# Patient Record
Sex: Female | Born: 1955
Health system: Southern US, Community
[De-identification: ages and names within clinical notes are randomized; demographics above are authoritative.]

## PROBLEM LIST (undated history)

## (undated) DIAGNOSIS — E559 Vitamin D deficiency, unspecified: Secondary | ICD-10-CM

## (undated) DIAGNOSIS — R0789 Other chest pain: Secondary | ICD-10-CM

## (undated) DIAGNOSIS — Z86018 Personal history of other benign neoplasm: Secondary | ICD-10-CM

## (undated) DIAGNOSIS — I5189 Other ill-defined heart diseases: Secondary | ICD-10-CM

## (undated) DIAGNOSIS — E785 Hyperlipidemia, unspecified: Secondary | ICD-10-CM

## (undated) HISTORY — DX: Other ill-defined heart diseases: I51.89

## (undated) HISTORY — PX: TUBAL LIGATION: SHX77

## (undated) HISTORY — PX: CARDIAC CATHETERIZATION: SHX172

## (undated) HISTORY — DX: Vitamin D deficiency, unspecified: E55.9

## (undated) HISTORY — DX: Hyperlipidemia, unspecified: E78.5

## (undated) HISTORY — DX: Other chest pain: R07.89

---

## 2018-03-07 ENCOUNTER — Other Ambulatory Visit (HOSPITAL_COMMUNITY): Payer: Self-pay | Admitting: Internal Medicine

## 2018-03-07 DIAGNOSIS — D151 Benign neoplasm of heart: Secondary | ICD-10-CM

## 2018-03-14 ENCOUNTER — Ambulatory Visit (HOSPITAL_COMMUNITY)
Admission: RE | Admit: 2018-03-14 | Discharge: 2018-03-14 | Disposition: A | Discharge status: Home / Self Care | Payer: BLUE CROSS/BLUE SHIELD | Source: Ambulatory Visit | Attending: Internal Medicine | Admitting: Internal Medicine

## 2018-03-14 DIAGNOSIS — D151 Benign neoplasm of heart: Secondary | ICD-10-CM | POA: Insufficient documentation

## 2018-03-14 DIAGNOSIS — R2240 Localized swelling, mass and lump, unspecified lower limb: Secondary | ICD-10-CM | POA: Diagnosis not present

## 2018-03-14 DIAGNOSIS — I517 Cardiomegaly: Secondary | ICD-10-CM | POA: Diagnosis not present

## 2018-03-14 MED ORDER — GADOBENATE DIMEGLUMINE 529 MG/ML IV SOLN
20.0000 mL | Freq: Once | INTRAVENOUS | Status: AC | PRN
  Administered 2018-03-14: 20 mL via INTRAVENOUS

## 2018-03-31 LAB — CREATININE, SERUM: Creatinine, Ser: 0.66 mg/dL (ref 0.61–1.24)

## 2018-04-16 ENCOUNTER — Encounter: Payer: Self-pay | Admitting: *Deleted

## 2018-04-16 DIAGNOSIS — E781 Pure hyperglyceridemia: Secondary | ICD-10-CM

## 2018-04-16 DIAGNOSIS — E559 Vitamin D deficiency, unspecified: Secondary | ICD-10-CM | POA: Insufficient documentation

## 2018-04-16 DIAGNOSIS — E785 Hyperlipidemia, unspecified: Secondary | ICD-10-CM | POA: Insufficient documentation

## 2018-04-17 ENCOUNTER — Encounter: Payer: BLUE CROSS/BLUE SHIELD | Admitting: Thoracic Surgery (Cardiothoracic Vascular Surgery)

## 2018-04-22 ENCOUNTER — Encounter: Payer: Self-pay | Admitting: Thoracic Surgery (Cardiothoracic Vascular Surgery)

## 2018-04-22 ENCOUNTER — Other Ambulatory Visit: Payer: Self-pay | Admitting: Thoracic Surgery (Cardiothoracic Vascular Surgery)

## 2018-04-22 ENCOUNTER — Other Ambulatory Visit: Payer: Self-pay

## 2018-04-22 ENCOUNTER — Institutional Professional Consult (permissible substitution): Payer: BLUE CROSS/BLUE SHIELD | Admitting: Thoracic Surgery (Cardiothoracic Vascular Surgery)

## 2018-04-22 VITALS — BP 134/86 | HR 86 | Resp 16 | Ht 66.0 in | Wt 128.0 lb

## 2018-04-22 DIAGNOSIS — R079 Chest pain, unspecified: Secondary | ICD-10-CM | POA: Insufficient documentation

## 2018-04-22 DIAGNOSIS — I7101 Dissection of thoracic aorta: Secondary | ICD-10-CM

## 2018-04-22 DIAGNOSIS — I5189 Other ill-defined heart diseases: Secondary | ICD-10-CM

## 2018-04-22 DIAGNOSIS — I71019 Dissection of thoracic aorta, unspecified: Secondary | ICD-10-CM

## 2018-04-22 DIAGNOSIS — R0789 Other chest pain: Secondary | ICD-10-CM | POA: Diagnosis not present

## 2018-04-22 DIAGNOSIS — D151 Benign neoplasm of heart: Secondary | ICD-10-CM | POA: Insufficient documentation

## 2018-04-22 DIAGNOSIS — I519 Heart disease, unspecified: Secondary | ICD-10-CM | POA: Diagnosis not present

## 2018-04-22 NOTE — Patient Instructions (Signed)
Continue all previous medications without any changes at this time  

## 2018-04-22 NOTE — Progress Notes (Signed)
[address]       [place] [address]             [phone]     CARDIOTHORACIC SURGERY CONSULTATION REPORT  Referring Provider is Raelyn Number, MD PCP is Raelyn Number, MD  Chief Complaint  Patient presents with  . Atrial Mass    Surgical eval ..Marland KitchenMRI CARDIAC MORPH. 03/14/18..OUTSIDE ECHO 02/26/18    HPI:  Patient is a 62 year old female who has been referred for evaluation of left atrial mass suspicious for possible atrial myxoma.  Patient is originally from [place] but has lived in [place] for more than 15 years.  Approximately 2 years ago she began to experience symptoms of chest discomfort that are somewhat atypical but exacerbated by physical exertion.  Symptoms are described as mild dull pain across her chest that occasionally radiates through to the back.  Symptoms are "always present" but seem to be relieved by rest.  Symptoms have been associated with decreased energy and exertional shortness of breath.  The patient was reportedly evaluated while she was traveling in [place] and told that she had a likely atrial myxoma.  Surgical intervention was recommended but the patient desired to return home for medical therapy.  She was recently seen by her primary care physician and an echocardiogram performed February 26, 2018.  By report the echocardiogram revealed normal left ventricular size and systolic function with an echogenic mass in the left atrium measuring 1.9 x 1.4 cm suspicious for atrial myxoma.  The patient subsequently underwent cardiac gated MRI on March 14, 2018.  This confirmed the presence of a heterogeneous globular nonenhancing mass in the left atrium attached to the intra-atrial septum measuring 2.4 x 2.0 cm.  There was normal left ventricular size and systolic function.  On delayed gadolinium imaging there was no late enhancement.  No other abnormalities were noted and TEE was recommended.  The patient was referred for surgical consultation.  The  patient is widowed and lives locally in [place].  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  She lives with 2 of her daughters.  She has remained healthy all of her life and reasonably active physically.  She describes a gradual decline in energy with decreased exercise tolerance.  Appetite is decreased but the patient has not lost weight.  She describes dull pain across her chest which now seems to radiate through to her back.  The pain is exacerbated by physical exertion and partially relieved by rest although the patient states the pain never completely goes away.  She describes mild exertional shortness of breath.  Shortness of breath and discomfort are made worse by laying flat in bed.  She has not had any palpitations, dizzy spells, nor syncope.  She denies any history of lower extremity edema or chronic cough.  Past Medical History:  Diagnosis Date  . Atypical chest pain   . Hyperlipidemia   . Left atrial mass   . Vitamin D deficiency     Past Surgical History:  Procedure Laterality Date  . TUBAL LIGATION      History reviewed. No pertinent family history.  Social History   Socioeconomic History  . Marital status: Widowed    Spouse name: Not on file  . Number of children: Not on file  . Years of education: Not on file  . Highest education level: Not on file  Occupational History  . Not on file  Social Needs  .  Financial resource strain: Not on file  . Food insecurity:    Worry: Not on file    Inability: Not on file  . Transportation needs:    Medical: Not on file    Non-medical: Not on file  Tobacco Use  . Smoking status: Never Smoker  . Smokeless tobacco: Never Used  Substance and Sexual Activity  . Alcohol use: Never    Frequency: Never  . Drug use: Never  . Sexual activity: Not on file  Lifestyle  . Physical activity:    Days per week: Not on file    Minutes per session: Not on file  . Stress: Not on file  Relationships  . Social  connections:    Talks on phone: Not on file    Gets together: Not on file    Attends religious service: Not on file    Active member of club or organization: Not on file    Attends meetings of clubs or organizations: Not on file    Relationship status: Not on file  . Intimate partner violence:    Fear of current or ex partner: Not on file    Emotionally abused: Not on file    Physically abused: Not on file    Forced sexual activity: Not on file  Other Topics Concern  . Not on file  Social History Narrative  . Not on file    Current Outpatient Medications  Medication Sig Dispense Refill  . atorvastatin (LIPITOR) 40 MG tablet Take 40 mg by mouth daily.    . Vitamin D, Ergocalciferol, 2000 units CAPS Take 1 capsule by mouth daily.     No current facility-administered medications for this visit.     Not on File    Review of Systems:   General:  decreased appetite, decreased energy, no weight gain, no weight loss, no fever  Cardiac:  + chest pain with exertion, mild chest pain at rest, + SOB with exertion, no resting SOB, no PND, + orthopnea, no palpitations, no arrhythmia, no atrial fibrillation, no LE edema, no dizzy spells, no syncope  Respiratory:  no shortness of breath, no home oxygen, no productive cough, no dry cough, no bronchitis, no wheezing, no hemoptysis, no asthma, no pain with inspiration or cough, no sleep apnea, no CPAP at night  GI:   no difficulty swallowing, no reflux, no frequent heartburn, no hiatal hernia, no abdominal pain, + constipation, no diarrhea, no hematochezia, no hematemesis, no melena  GU:   no dysuria,  no frequency, no urinary tract infection, no hematuria, no kidney stones, no kidney disease  Vascular:  no pain suggestive of claudication, no pain in feet, no leg cramps, no varicose veins, no DVT, no non-healing foot ulcer  Neuro:   no stroke, no TIA's, no seizures, no headaches, no temporary blindness one eye,  no slurred speech, no peripheral  neuropathy, no chronic pain, no instability of gait, no memory/cognitive dysfunction  Musculoskeletal: no arthritis, no joint swelling, no myalgias, no difficulty walking, normal mobility   Skin:   no rash, no itching, no skin infections, no pressure sores or ulcerations  Psych:   no anxiety, no depression, no nervousness, no unusual recent stress  Eyes:   no blurry vision, no floaters, no recent vision changes, does not wear glasses or contacts  ENT:   no hearing loss, no loose or painful teeth, full set dentures  Hematologic:  no easy bruising, no abnormal bleeding, no clotting disorder, no frequent epistaxis  Endocrine:  no diabetes, does not check CBG's at home     Physical Exam:   BP 134/86 (BP Location: Right Arm, Patient Position: Sitting, Cuff Size: Large)   Pulse 86   Resp 16   Ht 5\' 6"  (1.676 m)   Wt 128 lb (58.1 kg)   SpO2 97% Comment: ON RA  BMI 20.66 kg/m   General:    well-appearing  HEENT:  Unremarkable   Neck:   no JVD, no bruits, no adenopathy   Chest:   clear to auscultation, symmetrical breath sounds, no wheezes, no rhonchi   CV:   RRR, no  murmur   Abdomen:  soft, non-tender, no masses   Extremities:  warm, well-perfused, pulses diminished but palpable, no LE edema  Rectal/GU  Deferred  Neuro:   Grossly non-focal and symmetrical throughout  Skin:   Clean and dry, no rashes, no breakdown   Diagnostic Tests:  TRANSTHORACIC ECHOCARDIOGRAM  Report from transthoracic echocardiogram performed February 26, 2018 is reviewed.  Images from this report are not currently available for review.  By report the patient had normal left ventricular size and systolic function with ejection fraction estimated 60 to 65%.  There was normal left atrial size with an echogenic mass measuring 1.9 x 1.4 cm seen attached to the intra-atrial septum and towards the posterior wall of the left atrium, most consistent with atrial myxoma.  There was normal right atrial size.  Atrial septum appeared  normal with no evidence for intra-atrial shunt.  The aortic valve appeared normal.  The mitral valve appeared normal with trace mitral regurgitation.  No other significant abnormalities were noted.   CARDIAC MRI  TECHNIQUE: The patient was scanned on a 1.5 Tesla GE magnet. A dedicated cardiac coil was used. Functional imaging was done using Fiesta sequences. 2,3, and 4 chamber views were done to assess for RWMA's. Modified Simpson's rule using a short axis stack was used to calculate an ejection fraction on a dedicated work Conservation officer, nature. The patient received 30 cc of Multihance. After 10 minutes inversion recovery sequences were used to assess for infiltration and scar tissue.  CONTRAST:  30 cc Multihance  FINDINGS: Limited images of the lung fields showed no gross abnormalities.  Normal left ventricular size and wall thickness. Normal wall motion with EF 75%. Mildly dilated RV with normal systolic function. Normal left and right atrial sizes. There was a 2.4 x 2.0 cm heterogeneous globular non-enhancing mass in the left atrium attached to the interatrial septum. Trileaflet aortic valve with no significant regurgitation or stenosis. No significant mitral regurgitation.  On delayed enhancement imaging, there was no myocardial late gadolinium enhancement (LGE).  Measurements:  LVEDV 81 mL  LVSV 60 mL  LVEF 75%  IMPRESSION: 1.  Normal LV size with EF 75%, normal wall motion.  2.  Mildly dilated RV with normal systolic function.  3. No myocardial LGE so no evidence for prior infarction, infiltrative disease, or myocarditis.  4. 2.4 x 2.2 cm heterogeneous globular non-enhancing mass in the LA attached to the interatrial septum. This could be a left atrial myxoma, less likely thrombus. Would do TEE to assess more closely.  Dalton Mclean   Electronically Signed   By: Loralie Champagne M.D.   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  Patient will need elective surgical resection.  TEE would be useful to confirm the likely diagnosis and further evaluate the size and location of the presumed  attachment to the intra-atrial septum.  Diagnostic cardiac catheterization will need to be performed to rule out the presence of significant coronary artery disease.  In the absence of significant coronary artery disease or other complicating features, the patient may be a good candidate for minimally invasive approach for surgery.   Plan:  I have discussed the nature of the patient's presumed diagnosis and the indications for surgical resection at length with the patient and her family in the office today.  Alternative surgical approaches have been discussed including a comparison between conventional median sternotomy and minimally invasive techniques.  Risks associated with surgery and expectations for the patient's convalescence have been discussed.  The patient is interested in proceeding with surgery as soon as practical.  As a next step the patient will be referred for cardiology consultation and further diagnostic testing to include TEE and coronary angiography.  CT angiography of the aorta and iliac vessels will be performed to evaluate the feasibility of peripheral cannulation for surgery.  We tentatively plan to proceed with surgery on May 15, 2018.  Patient will return to our office for follow-up prior to surgery on May 12, 2018.  All questions answered.   I spent in excess of 90 minutes during the conduct of this office consultation and >50% of this time involved direct face-to-face encounter with the patient for counseling and/or coordination of their care.    Valentina Gu. Roxy Manns, MD 04/22/2018 1:40 PM

## 2018-04-23 ENCOUNTER — Other Ambulatory Visit: Payer: Self-pay

## 2018-04-23 ENCOUNTER — Encounter: Payer: BLUE CROSS/BLUE SHIELD | Admitting: Thoracic Surgery (Cardiothoracic Vascular Surgery)

## 2018-04-23 DIAGNOSIS — D151 Benign neoplasm of heart: Secondary | ICD-10-CM

## 2018-04-24 ENCOUNTER — Ambulatory Visit (HOSPITAL_COMMUNITY): Payer: BLUE CROSS/BLUE SHIELD

## 2018-04-24 ENCOUNTER — Other Ambulatory Visit: Payer: Self-pay

## 2018-04-24 ENCOUNTER — Encounter (HOSPITAL_COMMUNITY): Payer: Self-pay

## 2018-04-24 ENCOUNTER — Ambulatory Visit (HOSPITAL_COMMUNITY)
Admission: RE | Admit: 2018-04-24 | Discharge: 2018-04-24 | Disposition: A | Discharge status: Home / Self Care | Payer: BLUE CROSS/BLUE SHIELD | Source: Ambulatory Visit | Attending: Cardiology | Admitting: Cardiology

## 2018-04-24 ENCOUNTER — Encounter (HOSPITAL_COMMUNITY)
Admission: RE | Disposition: A | Discharge status: Home / Self Care | Payer: Self-pay | Source: Ambulatory Visit | Attending: Cardiology

## 2018-04-24 DIAGNOSIS — D151 Benign neoplasm of heart: Secondary | ICD-10-CM | POA: Insufficient documentation

## 2018-04-24 DIAGNOSIS — R0789 Other chest pain: Secondary | ICD-10-CM | POA: Insufficient documentation

## 2018-04-24 DIAGNOSIS — E785 Hyperlipidemia, unspecified: Secondary | ICD-10-CM | POA: Insufficient documentation

## 2018-04-24 DIAGNOSIS — R931 Abnormal findings on diagnostic imaging of heart and coronary circulation: Secondary | ICD-10-CM | POA: Diagnosis present

## 2018-04-24 HISTORY — PX: TEE WITHOUT CARDIOVERSION: SHX5443

## 2018-04-24 SURGERY — ECHOCARDIOGRAM, TRANSESOPHAGEAL
Anesthesia: Moderate Sedation

## 2018-04-24 MED ORDER — BUTAMBEN-TETRACAINE-BENZOCAINE 2-2-14 % EX AERO
INHALATION_SPRAY | CUTANEOUS | Status: DC | PRN
  Administered 2018-04-24: 2 via TOPICAL

## 2018-04-24 MED ORDER — MIDAZOLAM HCL 5 MG/5ML IJ SOLN
INTRAMUSCULAR | Status: DC | PRN
  Administered 2018-04-24: 1 mg via INTRAVENOUS
  Administered 2018-04-24 (×2): 2 mg via INTRAVENOUS

## 2018-04-24 MED ORDER — FENTANYL CITRATE (PF) 100 MCG/2ML IJ SOLN
INTRAMUSCULAR | Status: AC
  Filled 2018-04-24: qty 2

## 2018-04-24 MED ORDER — SODIUM CHLORIDE 0.9 % IV SOLN
INTRAVENOUS | Status: DC
  Administered 2018-04-24: 500 mL via INTRAVENOUS

## 2018-04-24 MED ORDER — MIDAZOLAM HCL 5 MG/ML IJ SOLN
INTRAMUSCULAR | Status: AC
  Filled 2018-04-24: qty 2

## 2018-04-24 MED ORDER — FENTANYL CITRATE (PF) 100 MCG/2ML IJ SOLN
INTRAMUSCULAR | Status: DC | PRN
  Administered 2018-04-24: 50 ug via INTRAVENOUS

## 2018-04-24 NOTE — Discharge Instructions (Signed)
Transesophageal Echocardiogram Transesophageal echocardiography (TEE) is a special type of test that produces images of the heart by using sound waves (echocardiogram). This type of echocardiography can obtain better images of the heart than standard echocardiography. TEE is done by passing a flexible tube down the esophagus. The heart is located in front of the esophagus. Because the heart and esophagus are close to one another, your health care provider can take very clear, detailed pictures of the heart via ultrasound waves. TEE may be done:  If your health care provider needs more information based on standard echocardiography findings.  If you had a stroke. This might have happened because a clot formed in your heart. TEE can visualize different areas of the heart and check for clots.  To check valve anatomy and function.  To check for infection on the inside of your heart (endocarditis).  To evaluate the dividing wall (septum) of the heart and presence of a hole that did not close after birth (patent foramen ovale or atrial septal defect).  To help diagnose a tear in the wall of the aorta (aortic dissection).  During cardiac valve surgery. This allows the surgeon to assess the valve repair before closing the chest.  During a variety of other cardiac procedures to guide positioning of catheters.  Sometimes before a cardioversion, which is a shock to convert heart rhythm back to normal.  Tell a health care provider about:  Any allergies you have.  All medicines you are taking, including vitamins, herbs, eye drops, creams, and over-the-counter medicines.  Any problems you or family members have had with anesthetic medicines.  Any blood disorders you have.  Any surgeries you have had.  Any medical conditions you have.  Swallowing difficulties.  An esophageal obstruction. What are the risks? Generally, TEE is a safe procedure. However, as with any procedure, complications can  occur. Possible complications include an esophageal tear (rupture). What happens before the procedure?  Do not eat or drink for 6 hours before the procedure or as directed by your health care provider.  Arrange for someone to drive you home after the procedure. Do not drive yourself home. During the procedure, you will be given medicines that can continue to make you feel drowsy and can impair your reflexes.  An IV access tube will be started in the arm. What happens during the procedure?  A medicine to help you relax (sedative) will be given through the IV access tube.  A medicine may be sprayed or gargled to numb the back of the throat.  Your blood pressure, heart rate, and breathing (vital signs) will be monitored during the procedure.  The TEE probe is a long, flexible tube. The tip of the probe is placed into the back of the mouth, and you will be asked to swallow. This helps to pass the tip of the probe into the esophagus. Once the tip of the probe is in the correct area, your health care provider can take pictures of the heart.  TEE is usually not a painful procedure. You may feel the probe press against the back of the throat. The probe does not enter the trachea and does not affect your breathing. What happens after the procedure?  You will be in bed, resting, until you have fully returned to consciousness.  When you first awaken, your throat may feel slightly sore and will probably still feel numb. This will improve slowly over time.  You will not be allowed to eat or drink until  it is clear that the numbness has improved.  Once you have been able to drink, urinate, and sit on the edge of the bed without feeling sick to your stomach (nausea) or dizzy, you may be cleared to go home.  You should have a friend or family member with you for the next 24 hours after your procedure. This information is not intended to replace advice given to you by your health care provider. Make  sure you discuss any questions you have with your health care provider. Document Released: 10/27/2002 Document Revised: 01/12/2016 Document Reviewed: 02/05/2013 Elsevier Interactive Patient Education  [employer].

## 2018-04-24 NOTE — Interval H&P Note (Signed)
History and Physical Interval Note:  04/24/2018 8:44 AM  Hannah Bond  has presented today for surgery, with the diagnosis of LEFT ATRIAL MYXOMA  The various methods of treatment have been discussed with the patient and family. After consideration of risks, benefits and other options for treatment, the patient has consented to  Procedure(s): TRANSESOPHAGEAL ECHOCARDIOGRAM (TEE) (N/A) as a surgical intervention .  The patient's history has been reviewed, patient examined, no change in status, stable for surgery.  I have reviewed the patient's chart and labs.  Questions were answered to the patient's satisfaction.     Adrian Prows

## 2018-04-24 NOTE — H&P (Signed)
Hannah Bond is an 62 y.o. female.   Chief Complaint: Here for TEE for suspected atrial mass HPI: Hannah Bond  is a 63 y.o. female  With suspected atrial mass and has been scheduled for TEE. No chest pain or dyspnea.  Past Medical History:  Diagnosis Date  . Atypical chest pain   . Hyperlipidemia   . Left atrial mass   . Vitamin D deficiency     Past Surgical History:  Procedure Laterality Date  . TUBAL LIGATION      History reviewed. No pertinent family history. Social History:  reports that she has never smoked. She has never used smokeless tobacco. She reports that she does not drink alcohol or use drugs.  Allergies: No Known Allergies    Blood pressure (!) 180/99, pulse 85, temperature 98.5 F (36.9 C), temperature source Oral, resp. rate 16, height 5\' 6"  (1.676 m), weight 58.1 kg, SpO2 100 %. Body mass index is 20.67 kg/m. Physical Exam  Constitutional: She is oriented to person, place, and time. She appears well-developed and well-nourished.  HENT:  Head: Atraumatic.  Eyes: Conjunctivae are normal.  Neck: Normal range of motion. Neck supple.  Cardiovascular: Normal rate, regular rhythm and intact distal pulses.  No murmur heard. Pulmonary/Chest: Effort normal and breath sounds normal.  Abdominal: Soft. Bowel sounds are normal.  Musculoskeletal: Normal range of motion.  Neurological: She is alert and oriented to person, place, and time.  Psychiatric: She has a normal mood and affect.    Medications Prior to Admission  Medication Sig Dispense Refill  . atorvastatin (LIPITOR) 40 MG tablet Take 40 mg by mouth daily.    Marland Kitchen ibuprofen (ADVIL,MOTRIN) 200 MG tablet Take 200 mg by mouth every 6 (six) hours as needed for headache or moderate pain.    . Vitamin D, Ergocalciferol, 2000 units CAPS Take 2,000 Units by mouth 3 (three) times a week.         Current Facility-Administered Medications:  .  0.9 %  sodium chloride infusion, , Intravenous, Continuous, Patwardhan,  Manish J, MD, Last Rate: 20 mL/hr at 04/24/18 0714, 500 mL at 04/24/18 0714 .  butamben-tetracaine-benzocaine (CETACAINE) spray, , , PRN, Adrian Prows, MD, 2 spray at 04/24/18 [phone] .  fentaNYL (SUBLIMAZE) injection, , , PRN, Adrian Prows, MD, 25 mcg at 04/24/18 [phone] .  midazolam (VERSED) 5 MG/5ML injection, , , PRN, Adrian Prows, MD, 2 mg at 04/24/18 1443  CARDIAC STUDIES:  TRANSTHORACIC ECHOCARDIOGRAM  Report from transthoracic echocardiogram performed February 26, 2018 is reviewed.  Images from this report are not currently available for review.  By report the patient had normal left ventricular size and systolic function with ejection fraction estimated 60 to 65%.  There was normal left atrial size with an echogenic mass measuring 1.9 x 1.4 cm seen attached to the intra-atrial septum and towards the posterior wall of the left atrium, most consistent with atrial myxoma.  There was normal right atrial size.  Atrial septum appeared normal with no evidence for intra-atrial shunt.  The aortic valve appeared normal.  The mitral valve appeared normal with trace mitral regurgitation.  No other significant abnormalities were noted.   CARDIAC MRI  TECHNIQUE: The patient was scanned on a 1.5 Tesla GE magnet. A dedicated cardiac coil was used. Functional imaging was done using Fiesta sequences. 2,3, and 4 chamber views were done to assess for RWMA's. Modified Simpson's rule using a short axis stack was used to calculate an ejection fraction on a dedicated work station  using [employer]. The patient received 30 cc of Multihance. After 10 minutes inversion recovery sequences were used to assess for infiltration and scar tissue.  CONTRAST: 30 cc Multihance  FINDINGS: Limited images of the lung fields showed no gross abnormalities.  Normal left ventricular size and wall thickness. Normal wall motion with EF 75%. Mildly dilated RV with normal systolic function. Normal left and right atrial sizes.  There was a 2.4 x 2.0 cm heterogeneous globular non-enhancing mass in the left atrium attached to the interatrial septum. Trileaflet aortic valve with no significant regurgitation or stenosis. No significant mitral regurgitation.  On delayed enhancement imaging, there was no myocardial late gadolinium enhancement (LGE).   Assessment/Plan LA mass suspicious for myxoma for TEE. All questions answered. Daughter  Present for translation  Adrian Prows, MD 04/24/2018, 8:40 AM [facility] [place] Pager: [phone] Office: [phone] If no answer: Cell:  [phone]

## 2018-04-24 NOTE — CV Procedure (Signed)
Indication: Left atrial mass/myxoma.  TEE: Under moderate sedation, TEE was performed without complications: LV: Normal. Normal EF. RV: Normal LA: There is a sessile 2.1x2.1 cm homogenous mass most consistent with LA myxoma attached to the septum primum in the fossa ovalis. No clot noted on the mass. No PFO by color Doppler.  Left atrial appendage: Normal without thrombus. Normal function. Inter atrial septum is intact without defect. Double contrast study negative for atrial level shunting. No late appearance of bubbles either. RA: Normal  MV: Normal Trace MR. TV: Normal Trace TR AV: Normal. No AI or AS. PV: Normal. Trace PI.  Thoracic and ascending aorta: Normal without significant plaque or atheromatous changes.  Conscious sedation protocol was followed, I personally administered conscious sedation and monitored the patient. Patient received 5 milligrams of Versed and 50 mcg fentanyl  . Patient tolerated the procedure well and there was no complication from conscious sedation. Time administered was 15 minutes.

## 2018-04-24 NOTE — H&P (View-Only) (Signed)
Hannah Bond is an 62 y.o. female.   Chief Complaint: Here for TEE for suspected atrial mass HPI: Hannah Bond  is a 62 y.o. female  With suspected atrial mass and has been scheduled for TEE. No chest pain or dyspnea.  Past Medical History:  Diagnosis Date  . Atypical chest pain   . Hyperlipidemia   . Left atrial mass   . Vitamin D deficiency     Past Surgical History:  Procedure Laterality Date  . TUBAL LIGATION      History reviewed. No pertinent family history. Social History:  reports that she has never smoked. She has never used smokeless tobacco. She reports that she does not drink alcohol or use drugs.  Allergies: No Known Allergies    Blood pressure (!) 180/99, pulse 85, temperature 98.5 F (36.9 C), temperature source Oral, resp. rate 16, height 5\' 6"  (1.676 m), weight 58.1 kg, SpO2 100 %. Body mass index is 20.67 kg/m. Physical Exam  Constitutional: She is oriented to person, place, and time. She appears well-developed and well-nourished.  HENT:  Head: Atraumatic.  Eyes: Conjunctivae are normal.  Neck: Normal range of motion. Neck supple.  Cardiovascular: Normal rate, regular rhythm and intact distal pulses.  No murmur heard. Pulmonary/Chest: Effort normal and breath sounds normal.  Abdominal: Soft. Bowel sounds are normal.  Musculoskeletal: Normal range of motion.  Neurological: She is alert and oriented to person, place, and time.  Psychiatric: She has a normal mood and affect.    Medications Prior to Admission  Medication Sig Dispense Refill  . atorvastatin (LIPITOR) 40 MG tablet Take 40 mg by mouth daily.    Marland Kitchen ibuprofen (ADVIL,MOTRIN) 200 MG tablet Take 200 mg by mouth every 6 (six) hours as needed for headache or moderate pain.    . Vitamin D, Ergocalciferol, 2000 units CAPS Take 2,000 Units by mouth 3 (three) times a week.         Current Facility-Administered Medications:  .  0.9 %  sodium chloride infusion, , Intravenous, Continuous, Patwardhan,  Manish J, MD, Last Rate: 20 mL/hr at 04/24/18 0714, 500 mL at 04/24/18 0714 .  butamben-tetracaine-benzocaine (CETACAINE) spray, , , PRN, Adrian Prows, MD, 2 spray at 04/24/18 [phone] .  fentaNYL (SUBLIMAZE) injection, , , PRN, Adrian Prows, MD, 25 mcg at 04/24/18 [phone] .  midazolam (VERSED) 5 MG/5ML injection, , , PRN, Adrian Prows, MD, 2 mg at 04/24/18 6301  CARDIAC STUDIES:  TRANSTHORACIC ECHOCARDIOGRAM  Report from transthoracic echocardiogram performed February 26, 2018 is reviewed.  Images from this report are not currently available for review.  By report the patient had normal left ventricular size and systolic function with ejection fraction estimated 60 to 65%.  There was normal left atrial size with an echogenic mass measuring 1.9 x 1.4 cm seen attached to the intra-atrial septum and towards the posterior wall of the left atrium, most consistent with atrial myxoma.  There was normal right atrial size.  Atrial septum appeared normal with no evidence for intra-atrial shunt.  The aortic valve appeared normal.  The mitral valve appeared normal with trace mitral regurgitation.  No other significant abnormalities were noted.   CARDIAC MRI  TECHNIQUE: The patient was scanned on a 1.5 Tesla GE magnet. A dedicated cardiac coil was used. Functional imaging was done using Fiest[employer] sequences. 2,3, and 4 chamber views were done to assess for RWMA's. Modified Simpson's rule using a short axis stack was used to calculate an ejection fraction on a dedicated work station  using [employer]. The patient received 30 cc of Multihance. After 10 minutes inversion recovery sequences were used to assess for infiltration and scar tissue.  CONTRAST: 30 cc Multihance  FINDINGS: Limited images of the lung fields showed no gross abnormalities.  Normal left ventricular size and wall thickness. Normal wall motion with EF 75%. Mildly dilated RV with normal systolic function. Normal left and right atrial sizes.  There was a 2.4 x 2.0 cm heterogeneous globular non-enhancing mass in the left atrium attached to the interatrial septum. Trileaflet aortic valve with no significant regurgitation or stenosis. No significant mitral regurgitation.  On delayed enhancement imaging, there was no myocardial late gadolinium enhancement (LGE).   Assessment/Plan LA mass suspicious for myxoma for TEE. All questions answered. Daughter  Present for translation  Adrian Prows, MD 04/24/2018, 8:40 AM [facility] [place] Pager: [phone] Office: [phone] If no answer: Cell:  [phone]

## 2018-04-28 ENCOUNTER — Encounter (HOSPITAL_COMMUNITY): Payer: Self-pay | Admitting: Cardiology

## 2018-04-29 ENCOUNTER — Ambulatory Visit (HOSPITAL_COMMUNITY)
Admission: RE | Disposition: A | Discharge status: Home / Self Care | Payer: Self-pay | Source: Ambulatory Visit | Attending: Cardiology

## 2018-04-29 ENCOUNTER — Ambulatory Visit (HOSPITAL_COMMUNITY)
Admission: RE | Admit: 2018-04-29 | Discharge: 2018-04-29 | Disposition: A | Discharge status: Home / Self Care | Payer: BLUE CROSS/BLUE SHIELD | Source: Ambulatory Visit | Attending: Cardiology | Admitting: Cardiology

## 2018-04-29 DIAGNOSIS — E785 Hyperlipidemia, unspecified: Secondary | ICD-10-CM | POA: Insufficient documentation

## 2018-04-29 DIAGNOSIS — I10 Essential (primary) hypertension: Secondary | ICD-10-CM | POA: Diagnosis not present

## 2018-04-29 DIAGNOSIS — R0789 Other chest pain: Secondary | ICD-10-CM | POA: Diagnosis not present

## 2018-04-29 DIAGNOSIS — D151 Benign neoplasm of heart: Secondary | ICD-10-CM | POA: Diagnosis not present

## 2018-04-29 DIAGNOSIS — Z01810 Encounter for preprocedural cardiovascular examination: Secondary | ICD-10-CM | POA: Insufficient documentation

## 2018-04-29 HISTORY — PX: RIGHT/LEFT HEART CATH AND CORONARY ANGIOGRAPHY: CATH118266

## 2018-04-29 LAB — POCT I-STAT 3, VENOUS BLOOD GAS (G3P V)
ACID-BASE DEFICIT: 1 mmol/L (ref 0.0–2.0)
BICARBONATE: 25.1 mmol/L (ref 20.0–28.0)
BICARBONATE: 25.6 mmol/L (ref 20.0–28.0)
O2 SAT: 77 %
O2 SAT: 78 %
PCO2 VEN: 46 mmHg (ref 44.0–60.0)
PH VEN: 7.354 (ref 7.250–7.430)
PO2 VEN: 44 mmHg (ref 32.0–45.0)
TCO2: 27 mmol/L (ref 22–32)
TCO2: 27 mmol/L (ref 22–32)
pCO2, Ven: 46 mmHg (ref 44.0–60.0)
pH, Ven: 7.346 (ref 7.250–7.430)
pO2, Ven: 45 mmHg (ref 32.0–45.0)

## 2018-04-29 LAB — POCT I-STAT 3, ART BLOOD GAS (G3+)
ACID-BASE DEFICIT: 1 mmol/L (ref 0.0–2.0)
BICARBONATE: 24.7 mmol/L (ref 20.0–28.0)
O2 Saturation: 95 %
PH ART: 7.364 (ref 7.350–7.450)
TCO2: 26 mmol/L (ref 22–32)
pCO2 arterial: 43.3 mmHg (ref 32.0–48.0)
pO2, Arterial: 80 mmHg — ABNORMAL LOW (ref 83.0–108.0)

## 2018-04-29 LAB — BASIC METABOLIC PANEL
Anion gap: 9 (ref 5–15)
BUN: 10 mg/dL (ref 8–23)
CHLORIDE: 106 mmol/L (ref 98–111)
CO2: 26 mmol/L (ref 22–32)
Calcium: 9.6 mg/dL (ref 8.9–10.3)
Creatinine, Ser: 0.58 mg/dL (ref 0.44–1.00)
GFR calc non Af Amer: >60 mL/min (ref >60)
Glucose, Bld: 103 mg/dL — ABNORMAL HIGH (ref 70–99)
POTASSIUM: 4 mmol/L (ref 3.5–5.1)
Sodium: 141 mmol/L (ref 135–145)

## 2018-04-29 LAB — CBC
HCT: 38.9 % (ref 36.0–46.0)
Hemoglobin: 12.8 g/dL (ref 12.0–15.0)
MCH: 29.3 pg (ref 26.0–34.0)
MCHC: 32.9 g/dL (ref 30.0–36.0)
MCV: 89 fL (ref 78.0–100.0)
Platelets: 216 10*3/uL (ref 150–400)
RBC: 4.37 MIL/uL (ref 3.87–5.11)
RDW: 12 % (ref 11.5–15.5)
WBC: 6.8 10*3/uL (ref 4.0–10.5)

## 2018-04-29 SURGERY — RIGHT/LEFT HEART CATH AND CORONARY ANGIOGRAPHY
Anesthesia: LOCAL

## 2018-04-29 MED ORDER — LIDOCAINE HCL (PF) 1 % IJ SOLN
INTRAMUSCULAR | Status: DC | PRN
  Administered 2018-04-29 (×2): 2 mL

## 2018-04-29 MED ORDER — SODIUM CHLORIDE 0.9 % IV SOLN
INTRAVENOUS | Status: AC | PRN
  Administered 2018-04-29: 250 mL via INTRAVENOUS

## 2018-04-29 MED ORDER — HYDROMORPHONE HCL 1 MG/ML IJ SOLN
INTRAMUSCULAR | Status: AC
  Filled 2018-04-29: qty 0.5

## 2018-04-29 MED ORDER — SODIUM CHLORIDE 0.9 % IV SOLN: 250.0000 mL | INTRAVENOUS | Status: DC | PRN

## 2018-04-29 MED ORDER — MIDAZOLAM HCL 2 MG/2ML IJ SOLN
INTRAMUSCULAR | Status: AC
  Filled 2018-04-29: qty 2

## 2018-04-29 MED ORDER — SODIUM CHLORIDE 0.9% FLUSH: 3.0000 mL | INTRAVENOUS | Status: DC | PRN

## 2018-04-29 MED ORDER — ASPIRIN 81 MG PO CHEW
81.0000 mg | CHEWABLE_TABLET | ORAL | Status: AC
  Administered 2018-04-29: 81 mg via ORAL

## 2018-04-29 MED ORDER — VERAPAMIL HCL 2.5 MG/ML IV SOLN
INTRAVENOUS | Status: DC | PRN
  Administered 2018-04-29: 14:00:00 via INTRA_ARTERIAL

## 2018-04-29 MED ORDER — HEPARIN SODIUM (PORCINE) 1000 UNIT/ML IJ SOLN
INTRAMUSCULAR | Status: AC
  Filled 2018-04-29: qty 1

## 2018-04-29 MED ORDER — LIDOCAINE HCL (PF) 1 % IJ SOLN
INTRAMUSCULAR | Status: AC
  Filled 2018-04-29: qty 30

## 2018-04-29 MED ORDER — IOHEXOL 350 MG/ML SOLN
INTRAVENOUS | Status: DC | PRN
  Administered 2018-04-29: 40 mL via INTRAVENOUS

## 2018-04-29 MED ORDER — MIDAZOLAM HCL 2 MG/2ML IJ SOLN
INTRAMUSCULAR | Status: DC | PRN
  Administered 2018-04-29: 1 mg via INTRAVENOUS

## 2018-04-29 MED ORDER — SODIUM CHLORIDE 0.9 % WEIGHT BASED INFUSION: 1.0000 mL/kg/h | INTRAVENOUS | Status: DC

## 2018-04-29 MED ORDER — ONDANSETRON HCL 4 MG/2ML IJ SOLN: 4.0000 mg | Freq: Four times a day (QID) | INTRAMUSCULAR | Status: DC | PRN

## 2018-04-29 MED ORDER — SODIUM CHLORIDE 0.9% FLUSH: 3.0000 mL | Freq: Two times a day (BID) | INTRAVENOUS | Status: DC

## 2018-04-29 MED ORDER — NITROGLYCERIN 1 MG/10 ML FOR IR/CATH LAB
INTRA_ARTERIAL | Status: AC
  Filled 2018-04-29: qty 10

## 2018-04-29 MED ORDER — HYDROMORPHONE HCL 1 MG/ML IJ SOLN
INTRAMUSCULAR | Status: DC | PRN
  Administered 2018-04-29 (×2): 0.5 mg via INTRAVENOUS

## 2018-04-29 MED ORDER — VERAPAMIL HCL 2.5 MG/ML IV SOLN
INTRAVENOUS | Status: AC
  Filled 2018-04-29: qty 2

## 2018-04-29 MED ORDER — HEPARIN (PORCINE) IN NACL 1000-0.9 UT/500ML-% IV SOLN
INTRAVENOUS | Status: AC
  Filled 2018-04-29: qty 1000

## 2018-04-29 MED ORDER — ACETAMINOPHEN 325 MG PO TABS: 650.0000 mg | ORAL_TABLET | ORAL | Status: DC | PRN

## 2018-04-29 MED ORDER — SODIUM CHLORIDE 0.9 % WEIGHT BASED INFUSION
3.0000 mL/kg/h | INTRAVENOUS | Status: AC
  Administered 2018-04-29: 3 mL/kg/h via INTRAVENOUS

## 2018-04-29 MED ORDER — HEPARIN SODIUM (PORCINE) 1000 UNIT/ML IJ SOLN
INTRAMUSCULAR | Status: DC | PRN
  Administered 2018-04-29: 3000 [IU] via INTRAVENOUS

## 2018-04-29 MED ORDER — ASPIRIN 81 MG PO CHEW
CHEWABLE_TABLET | ORAL | Status: AC
  Administered 2018-04-29: 81 mg via ORAL
  Filled 2018-04-29: qty 1

## 2018-04-29 SURGICAL SUPPLY — 11 items
CATH BALLN WEDGE 5F 110CM (CATHETERS) ×2 IMPLANT
CATH OPTITORQUE TIG 4.0 5F (CATHETERS) ×2 IMPLANT
DEVICE RAD TR BAND REGULAR (VASCULAR PRODUCTS) ×2 IMPLANT
GLIDESHEATH SLEND A-KIT 6F 20G (SHEATH) ×2 IMPLANT
GUIDEWIRE INQWIRE 1.5J.035X260 (WIRE) ×1 IMPLANT
INQWIRE 1.5J .035X260CM (WIRE) ×2
KIT HEART LEFT (KITS) ×2 IMPLANT
PACK CARDIAC CATHETERIZATION (CUSTOM PROCEDURE TRAY) ×2 IMPLANT
SHEATH GLIDE SLENDER 4/5FR (SHEATH) ×2 IMPLANT
TRANSDUCER W/STOPCOCK (MISCELLANEOUS) ×2 IMPLANT
TUBING CIL FLEX 10 FLL-RA (TUBING) ×2 IMPLANT

## 2018-04-29 NOTE — Discharge Instructions (Signed)

## 2018-04-29 NOTE — Progress Notes (Signed)
Pt reports she ate a small amt of  [place] [employer]  at 0930 this morning. Annie Main, Rn called and informed.

## 2018-04-29 NOTE — Interval H&P Note (Signed)
History and Physical Interval Note:  04/29/2018 2:39 PM  Hannah Bond  has presented today for surgery, with the diagnosis of aterial mass  The various methods of treatment have been discussed with the patient and family. After consideration of risks, benefits and other options for treatment, the patient has consented to  Procedure(s): RIGHT/LEFT HEART CATH AND CORONARY ANGIOGRAPHY (N/A) as a surgical intervention .  The patient's history has been reviewed, patient examined, no change in status, stable for surgery.  I have reviewed the patient's chart and labs.  Questions were answered to the patient's satisfaction.     Adrian Prows

## 2018-04-30 ENCOUNTER — Encounter (HOSPITAL_COMMUNITY): Payer: Self-pay | Admitting: Cardiology

## 2018-04-30 ENCOUNTER — Encounter: Payer: Self-pay | Admitting: *Deleted

## 2018-04-30 MED FILL — Heparin Sod (Porcine)-NaCl IV Soln 1000 Unit/500ML-0.9%: INTRAVENOUS | Qty: 1000 | Status: AC

## 2018-04-30 MED FILL — Nitroglycerin IV Soln 100 MCG/ML in D5W: INTRA_ARTERIAL | Qty: 10 | Status: AC

## 2018-05-06 ENCOUNTER — Ambulatory Visit
Admission: RE | Admit: 2018-05-06 | Discharge: 2018-05-06 | Disposition: A | Discharge status: Home / Self Care | Payer: BLUE CROSS/BLUE SHIELD | Source: Ambulatory Visit | Attending: Thoracic Surgery (Cardiothoracic Vascular Surgery) | Admitting: Thoracic Surgery (Cardiothoracic Vascular Surgery)

## 2018-05-06 DIAGNOSIS — I7101 Dissection of thoracic aorta: Secondary | ICD-10-CM

## 2018-05-06 DIAGNOSIS — I5189 Other ill-defined heart diseases: Secondary | ICD-10-CM

## 2018-05-06 DIAGNOSIS — I71019 Dissection of thoracic aorta, unspecified: Secondary | ICD-10-CM

## 2018-05-06 MED ORDER — IOPAMIDOL (ISOVUE-370) INJECTION 76%
75.0000 mL | Freq: Once | INTRAVENOUS | Status: AC | PRN
  Administered 2018-05-06: 75 mL via INTRAVENOUS

## 2018-05-09 ENCOUNTER — Ambulatory Visit (HOSPITAL_COMMUNITY)
Admission: RE | Admit: 2018-05-09 | Discharge: 2018-05-09 | Disposition: A | Discharge status: Home / Self Care | Payer: BLUE CROSS/BLUE SHIELD | Source: Ambulatory Visit | Attending: Thoracic Surgery (Cardiothoracic Vascular Surgery) | Admitting: Thoracic Surgery (Cardiothoracic Vascular Surgery)

## 2018-05-09 ENCOUNTER — Encounter (HOSPITAL_COMMUNITY)
Admission: RE | Admit: 2018-05-09 | Discharge: 2018-05-09 | Disposition: A | Discharge status: Home / Self Care | Payer: BLUE CROSS/BLUE SHIELD | Source: Ambulatory Visit | Attending: Thoracic Surgery (Cardiothoracic Vascular Surgery) | Admitting: Thoracic Surgery (Cardiothoracic Vascular Surgery)

## 2018-05-09 ENCOUNTER — Encounter (HOSPITAL_COMMUNITY): Payer: Self-pay

## 2018-05-09 DIAGNOSIS — E785 Hyperlipidemia, unspecified: Secondary | ICD-10-CM | POA: Insufficient documentation

## 2018-05-09 DIAGNOSIS — D151 Benign neoplasm of heart: Secondary | ICD-10-CM

## 2018-05-09 DIAGNOSIS — Z01818 Encounter for other preprocedural examination: Secondary | ICD-10-CM | POA: Diagnosis not present

## 2018-05-09 LAB — PULMONARY FUNCTION TEST
DL/VA % pred: 95 %
DL/VA: 4.92 ml/min/mmHg/L
DLCO UNC % PRED: 59 %
DLCO unc: 16.84 ml/min/mmHg
FEF 25-75 PRE: 2.5 L/s
FEF 25-75 Post: 2.73 L/sec
FEF2575-%CHANGE-POST: 9 %
FEF2575-%PRED-POST: 110 %
FEF2575-%PRED-PRE: 101 %
FEV1-%CHANGE-POST: 1 %
FEV1-%Pred-Post: 72 %
FEV1-%Pred-Pre: 71 %
FEV1-PRE: 2 L
FEV1-Post: 2.04 L
FEV1FVC-%CHANGE-POST: 0 %
FEV1FVC-%Pred-Pre: 120 %
FEV6-%Change-Post: 0 %
FEV6-%PRED-POST: 60 %
FEV6-%Pred-Pre: 60 %
FEV6-PRE: 2.14 L
FEV6-Post: 2.12 L
FEV6FVC-%PRED-PRE: 104 %
FEV6FVC-%Pred-Post: 104 %
FVC-%CHANGE-POST: 2 %
FVC-%PRED-POST: 59 %
FVC-%Pred-Pre: 58 %
FVC-Post: 2.18 L
FVC-Pre: 2.14 L
POST FEV1/FVC RATIO: 93 %
PRE FEV6/FVC RATIO: 100 %
Post FEV6/FVC ratio: 100 %
Pre FEV1/FVC ratio: 94 %
RV % PRED: 121 %
RV: 2.64 L
TLC % pred: 88 %
TLC: 4.87 L

## 2018-05-09 LAB — CBC
HCT: 38.9 % (ref 36.0–46.0)
HEMOGLOBIN: 12.4 g/dL (ref 12.0–15.0)
MCH: 28.6 pg (ref 26.0–34.0)
MCHC: 31.9 g/dL (ref 30.0–36.0)
MCV: 89.8 fL (ref 78.0–100.0)
Platelets: 207 10*3/uL (ref 150–400)
RBC: 4.33 MIL/uL (ref 3.87–5.11)
RDW: 12 % (ref 11.5–15.5)
WBC: 6.3 10*3/uL (ref 4.0–10.5)

## 2018-05-09 LAB — COMPREHENSIVE METABOLIC PANEL
ALBUMIN: 3.8 g/dL (ref 3.5–5.0)
ALK PHOS: 86 U/L (ref 38–126)
ALT: 17 U/L (ref 0–44)
AST: 22 U/L (ref 15–41)
Anion gap: 9 (ref 5–15)
BUN: 10 mg/dL (ref 8–23)
CALCIUM: 9.6 mg/dL (ref 8.9–10.3)
CO2: 24 mmol/L (ref 22–32)
CREATININE: 0.51 mg/dL (ref 0.44–1.00)
Chloride: 110 mmol/L (ref 98–111)
GFR calc Af Amer: >60 mL/min (ref >60)
GFR calc non Af Amer: >60 mL/min (ref >60)
GLUCOSE: 114 mg/dL — AB (ref 70–99)
Potassium: 3.9 mmol/L (ref 3.5–5.1)
SODIUM: 143 mmol/L (ref 135–145)
Total Bilirubin: 0.6 mg/dL (ref 0.3–1.2)
Total Protein: 7.4 g/dL (ref 6.5–8.1)

## 2018-05-09 LAB — URINALYSIS, ROUTINE W REFLEX MICROSCOPIC
BILIRUBIN URINE: NEGATIVE
Glucose, UA: NEGATIVE mg/dL
HGB URINE DIPSTICK: NEGATIVE
KETONES UR: NEGATIVE mg/dL
Leukocytes, UA: NEGATIVE
Nitrite: NEGATIVE
Protein, ur: NEGATIVE mg/dL
Specific Gravity, Urine: 1.012 (ref 1.005–1.030)
pH: 7 (ref 5.0–8.0)

## 2018-05-09 LAB — BLOOD GAS, ARTERIAL
Acid-Base Excess: 3 mmol/L — ABNORMAL HIGH (ref 0.0–2.0)
Bicarbonate: 27.1 mmol/L (ref 20.0–28.0)
Drawn by: 42180
FIO2: 21
O2 SAT: 97.9 %
PATIENT TEMPERATURE: 98.6
PCO2 ART: 41.9 mmHg (ref 32.0–48.0)
pH, Arterial: 7.427 (ref 7.350–7.450)
pO2, Arterial: 99.6 mmHg (ref 83.0–108.0)

## 2018-05-09 LAB — PROTIME-INR
INR: 1
Prothrombin Time: 13.1 seconds (ref 11.4–15.2)

## 2018-05-09 LAB — SURGICAL PCR SCREEN
MRSA, PCR: NEGATIVE
STAPHYLOCOCCUS AUREUS: NEGATIVE

## 2018-05-09 LAB — HEMOGLOBIN A1C
Hgb A1c MFr Bld: 5.6 % (ref 4.8–5.6)
Mean Plasma Glucose: 114.02 mg/dL

## 2018-05-09 LAB — APTT: aPTT: 32 seconds (ref 24–36)

## 2018-05-09 LAB — ABO/RH: ABO/RH(D): B POS

## 2018-05-09 MED ORDER — ALBUTEROL SULFATE (2.5 MG/3ML) 0.083% IN NEBU
2.5000 mg | INHALATION_SOLUTION | Freq: Once | RESPIRATORY_TRACT | Status: AC
  Administered 2018-05-09: 2.5 mg via RESPIRATORY_TRACT

## 2018-05-09 NOTE — Pre-Procedure Instructions (Signed)
Hannah Bond  05/09/2018      [facility] [address] [place] [address] Phone: [phone] Fax: [phone]    Your procedure is scheduled on 05/13/18.  Report to [facility] Admitting at 530 A.M.  Call this number if you have problems the morning of surgery:  [phone]   Remember:  Do not eat or drink after midnight.     Take these medicines the morning of surgery with A SIP OF WATER --none    Do not wear jewelry, make-up or nail polish.  Do not wear lotions, powders, or perfumes, or deodorant.  Do not shave 48 hours prior to surgery.  Men may shave face and neck.  Do not bring valuables to the hospital.  [facility] is not responsible for any belongings or valuables.  Contacts, dentures or bridgework may not be worn into surgery.  Leave your suitcase in the car.  After surgery it may be brought to your room.  For patients admitted to the hospital, discharge time will be determined by your treatment team.  Patients discharged the day of surgery will not be allowed to drive home.   Name and phone number of your driver:   Do not take any aspirin,anti-inflammatories,vitamins,or herbal supplements 5-7 days prior to surgery. Special instructions:  [place] - Preparing for Surgery  Before surgery, you can play an important role.  Because skin is not sterile, your skin needs to be as free of germs as possible.  You can reduce the number of germs on you skin by washing with CHG (chlorahexidine gluconate) soap before surgery.  CHG is an antiseptic cleaner which kills germs and bonds with the skin to continue killing germs even after washing.  Oral Hygiene is also important in reducing the risk of infection.  Remember to brush your teeth with your regular toothpaste the morning of surgery.  Please DO NOT use if you have an allergy to CHG or antibacterial soaps.  If your skin becomes  reddened/irritated stop using the CHG and inform your nurse when you arrive at Short Stay.  Do not shave (including legs and underarms) for at least 48 hours prior to the first CHG shower.  You may shave your face.  Please follow these instructions carefully:   1.  Shower with CHG Soap the night before surgery and the morning of Surgery.  2.  If you choose to wash your hair, wash your hair first as usual with your normal shampoo.  3.  After you shampoo, rinse your hair and body thoroughly to remove the shampoo. 4.  Use CHG as you would any other liquid soap.  You can apply chg directly to the skin and wash gently with a      scrungie or washcloth.           5.  Apply the CHG Soap to your body ONLY FROM THE NECK DOWN.   Do not use on open wounds or open sores. Avoid contact with your eyes, ears, mouth and genitals (private parts).  Wash genitals (private parts) with your normal soap.  6.  Wash thoroughly, paying special attention to the area where your surgery will be performed.  7.  Thoroughly rinse your body with warm water from the neck down.  8.  DO NOT shower/wash with your normal soap after using and rinsing off the CHG Soap.  9.  Pat yourself dry with  a clean towel.            10.  Wear clean pajamas.            11.  Place clean sheets on your bed the night of your first shower and do not sleep with pets.  Day of Surgery  Do not apply any lotions/deoderants the morning of surgery.   Please wear clean clothes to the hospital/surgery center. Remember to brush your teeth with toothpaste.    Please read over the following fact sheets that you were given. MRSA Information

## 2018-05-09 NOTE — Progress Notes (Signed)
Pre-op Cardiac Surgery  Carotid Findings:  Findings suggest 1-39% internal carotid artery stenosis bilaterally. Vertebral arteries are patent with antegrade flow.  Upper Extremity Right Left  Brachial Pressures 138-Triphasic 133-Triphasic  Radial Waveforms Triphasic Triphasic  Ulnar Waveforms Triphasic Triphasic  Palmar Arch (Allen's Test) Signal is unaffected with radial compression, decreases <50% with ulnar compression.  Signal is unaffected with radial compression, decreases <50% with ulnar compression.     05/09/2018 1:46 PM Maudry Mayhew, MHA, RVT, RDCS, RDMS

## 2018-05-12 ENCOUNTER — Encounter (HOSPITAL_COMMUNITY): Payer: BLUE CROSS/BLUE SHIELD

## 2018-05-12 ENCOUNTER — Encounter: Payer: Self-pay | Admitting: Thoracic Surgery (Cardiothoracic Vascular Surgery)

## 2018-05-12 ENCOUNTER — Encounter (HOSPITAL_COMMUNITY): Payer: Self-pay | Admitting: Certified Registered Nurse Anesthetist

## 2018-05-12 ENCOUNTER — Ambulatory Visit: Payer: BLUE CROSS/BLUE SHIELD | Admitting: Thoracic Surgery (Cardiothoracic Vascular Surgery)

## 2018-05-12 VITALS — BP 136/83 | HR 74 | Resp 20 | Ht 64.0 in | Wt 128.0 lb

## 2018-05-12 DIAGNOSIS — D151 Benign neoplasm of heart: Secondary | ICD-10-CM

## 2018-05-12 MED ORDER — POTASSIUM CHLORIDE 2 MEQ/ML IV SOLN
80.0000 meq | INTRAVENOUS | Status: DC
  Filled 2018-05-12: qty 40

## 2018-05-12 MED ORDER — PHENYLEPHRINE HCL-NACL 20-0.9 MG/250ML-% IV SOLN
30.0000 ug/min | INTRAVENOUS | Status: AC
  Administered 2018-05-13: 15 ug/min via INTRAVENOUS
  Filled 2018-05-12: qty 250

## 2018-05-12 MED ORDER — SODIUM CHLORIDE 0.9 % IV SOLN
INTRAVENOUS | Status: AC
  Administered 2018-05-13: 1 [IU]/h via INTRAVENOUS
  Filled 2018-05-12: qty 1

## 2018-05-12 MED ORDER — KENNESTONE BLOOD CARDIOPLEGIA (KBC) MANNITOL SYRINGE (20%, 32ML)
32.0000 mL | Freq: Once | INTRAVENOUS | Status: DC
  Filled 2018-05-12: qty 32

## 2018-05-12 MED ORDER — TRANEXAMIC ACID (OHS) PUMP PRIME SOLUTION
2.0000 mg/kg | INTRAVENOUS | Status: DC
  Filled 2018-05-12: qty 1.16

## 2018-05-12 MED ORDER — KENNESTONE BLOOD CARDIOPLEGIA VIAL
13.0000 mL | Freq: Once | Status: DC
  Filled 2018-05-12 (×2): qty 13

## 2018-05-12 MED ORDER — SODIUM CHLORIDE 0.9 % IV SOLN
750.0000 mg | INTRAVENOUS | Status: DC
  Filled 2018-05-12: qty 750

## 2018-05-12 MED ORDER — EPINEPHRINE PF 1 MG/ML IJ SOLN
0.0000 ug/min | INTRAVENOUS | Status: DC
  Filled 2018-05-12: qty 4

## 2018-05-12 MED ORDER — TRANEXAMIC ACID 1000 MG/10ML IV SOLN
1.5000 mg/kg/h | INTRAVENOUS | Status: AC
  Administered 2018-05-13: 1.5 mg/kg/h via INTRAVENOUS
  Filled 2018-05-12: qty 25

## 2018-05-12 MED ORDER — KENNESTONE BLOOD CARDIOPLEGIA VIAL
13.0000 mL | Freq: Once | Status: DC
  Filled 2018-05-12: qty 13

## 2018-05-12 MED ORDER — VANCOMYCIN HCL 10 G IV SOLR
1250.0000 mg | INTRAVENOUS | Status: AC
  Administered 2018-05-13: 1250 mg via INTRAVENOUS
  Filled 2018-05-12: qty 1250

## 2018-05-12 MED ORDER — DEXMEDETOMIDINE HCL IN NACL 400 MCG/100ML IV SOLN
0.1000 ug/kg/h | INTRAVENOUS | Status: AC
  Administered 2018-05-13: .7 ug/kg/h via INTRAVENOUS
  Filled 2018-05-12: qty 100

## 2018-05-12 MED ORDER — TRANEXAMIC ACID (OHS) BOLUS VIA INFUSION
15.0000 mg/kg | INTRAVENOUS | Status: AC
  Administered 2018-05-13: 871.5 mg via INTRAVENOUS
  Filled 2018-05-12: qty 872

## 2018-05-12 MED ORDER — MAGNESIUM SULFATE 50 % IJ SOLN
40.0000 meq | INTRAMUSCULAR | Status: DC
  Filled 2018-05-12: qty 9.85

## 2018-05-12 MED ORDER — SODIUM CHLORIDE 0.9 % IV SOLN
1.5000 g | INTRAVENOUS | Status: AC
  Administered 2018-05-13: .75 g via INTRAVENOUS
  Administered 2018-05-13: 1.5 g via INTRAVENOUS
  Filled 2018-05-12: qty 1.5

## 2018-05-12 MED ORDER — SODIUM CHLORIDE 0.9 % IV SOLN
INTRAVENOUS | Status: DC
  Filled 2018-05-12: qty 30

## 2018-05-12 MED ORDER — PLASMA-LYTE 148 IV SOLN
INTRAVENOUS | Status: DC
  Filled 2018-05-12: qty 2.5

## 2018-05-12 MED ORDER — MILRINONE LACTATE IN DEXTROSE 20-5 MG/100ML-% IV SOLN
0.3000 ug/kg/min | INTRAVENOUS | Status: DC
  Filled 2018-05-12: qty 100

## 2018-05-12 MED ORDER — NITROGLYCERIN IN D5W 200-5 MCG/ML-% IV SOLN
2.0000 ug/min | INTRAVENOUS | Status: DC
  Filled 2018-05-12: qty 250

## 2018-05-12 MED ORDER — VANCOMYCIN HCL 1000 MG IV SOLR
INTRAVENOUS | Status: AC
  Administered 2018-05-13: 1000 mL
  Filled 2018-05-12: qty 1000

## 2018-05-12 MED ORDER — DOPAMINE-DEXTROSE 3.2-5 MG/ML-% IV SOLN
0.0000 ug/kg/min | INTRAVENOUS | Status: DC
  Filled 2018-05-12: qty 250

## 2018-05-12 NOTE — Patient Instructions (Signed)
   Continue taking all current medications without change through the day before surgery.  Have nothing to eat or drink after midnight the night before surgery.  On the morning of surgery do not take any medications   

## 2018-05-12 NOTE — Progress Notes (Signed)
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  She was originally seen in consultation on April 22, 2018.  Since then she underwent transesophageal echocardiogram and diagnostic cardiac catheterization by Dr. Einar Gip.  TEE confirmed the presence of a 2.1 cm pedunculated mass in the left atrium adherent to the fossa ovalis consistent with atrial myxoma.  No other abnormalities were noted.  Left ventricular size and systolic function was normal.  Left and right heart catheterization revealed normal coronary artery anatomy with no significant coronary artery disease.  Right heart pressures were normal.  The patient returns to the office today with her daughter present serving as a interpreter.  They report no new problems or complaints.  The patient is eager to proceed with surgery tomorrow as previously arranged.   Current Outpatient Medications  Medication Sig Dispense Refill  . atorvastatin (LIPITOR) 40 MG tablet Take 40 mg by mouth at bedtime.     . Multiple Vitamin (MULTIVITAMIN WITH MINERALS) TABS tablet Take 1 tablet by mouth daily.     No current facility-administered medications for this visit.    Facility-Administered Medications Ordered in Other Visits  Medication Dose Route Frequency Provider Last Rate Last Dose  . [START ON 05/13/2018] cefUROXime (ZINACEF) 1.5 g in sodium chloride 0.9 % 100 mL IVPB  1.5 g Intravenous To OR Rexene Alberts, MD      . Derrill Memo ON 05/13/2018] cefUROXime (ZINACEF) 750 mg in sodium chloride 0.9 % 100 mL IVPB  750 mg Intravenous To OR Rexene Alberts, MD      . Derrill Memo ON 05/13/2018] dexmedetomidine (PRECEDEX) 400 MCG/100ML (4 mcg/mL) infusion  0.1-0.7 mcg/kg/hr Intravenous To OR Rexene Alberts, MD      . Derrill Memo ON 05/13/2018] DOPamine (INTROPIN) 800 mg in dextrose 5 % 250 mL (3.2 mg/mL) infusion  0-10 mcg/kg/min Intravenous To OR Rexene Alberts, MD      . Derrill Memo ON 05/13/2018] EPINEPHrine (ADRENALIN) 4 mg in dextrose 5 % 250 mL (0.016 mg/mL) infusion  0-10 mcg/min Intravenous To OR Rexene Alberts, MD      . Derrill Memo ON 05/13/2018] heparin 2,500 Units, papaverine 30 mg in electrolyte-148 (PLASMALYTE-148) 500 mL irrigation   Irrigation To OR Rexene Alberts, MD      . Derrill Memo ON 05/13/2018] heparin 30,000 units/NS 1000 mL solution for CELLSAVER   Other To OR Rexene Alberts, MD      . Derrill Memo ON 05/13/2018] insulin regular (NOVOLIN R,HUMULIN R) 100 Units in sodium chloride 0.9 % 100 mL (1 Units/mL) infusion   Intravenous To OR Rexene Alberts, MD      . Derrill Memo ON 05/13/2018] [facility] Cardioplegia (KBC) lidocaine 2% Syringe (26mL)  13 mL Intracoronary Once Rexene Alberts, MD      . Derrill Memo ON 05/13/2018] [facility] Blood Cardioplegia (KBC) lidocaine 2% Syringe (54mL)  13 mL Intracoronary Once Rexene Alberts, MD      . Derrill Memo ON 05/13/2018] [facility] Blood [facility]oplegia (KBC) mannitol 20% Syringe (30mL)  32 mL Intracoronary Once Rexene Alberts, MD      . Derrill Memo ON 05/13/2018] [facility] Blood Cardioplegia (KBC) mannitol 20% Syringe (21mL)  32 mL Intracoronary Once Rexene Alberts, MD      . Derrill Memo ON 05/13/2018] magnesium sulfate (IV Push/IM) injection 40 mEq  40 mEq Other To OR Rexene Alberts, MD      . Derrill Memo ON 05/13/2018] milrinone (PRIMACOR) 20 MG/100 ML (0.2 mg/mL) infusion  0.3 mcg/kg/min Intravenous To OR Rexene Alberts, MD      . Derrill Memo ON 05/13/2018] nitroGLYCERIN 50 mg in dextrose 5 % 250 mL (0.2 mg/mL) infusion  2-200 mcg/min Intravenous To OR Rexene Alberts, MD      . Derrill Memo ON 05/13/2018] phenylephrine  (NEOSYNEPHRINE) 20-0.9 MG/250ML-% infusion  30-200 mcg/min Intravenous To OR Rexene Alberts, MD      . Derrill Memo ON 05/13/2018] potassium chloride injection 80 mEq  80 mEq Other To OR Rexene Alberts, MD      . Derrill Memo ON 05/13/2018] tranexamic acid (CYKLOKAPRON) 2,500 mg in sodium chloride 0.9 % 250 mL (10 mg/mL) infusion  1.5 mg/kg/hr Intravenous To OR Rexene Alberts, MD      . Derrill Memo ON 05/13/2018] tranexamic acid (CYKLOKAPRON) bolus via infusion - over 30 minutes 871.5 mg  15 mg/kg Intravenous To OR Rexene Alberts, MD      . Derrill Memo ON 05/13/2018] tranexamic acid (CYKLOKAPRON) pump prime solution 116 mg  2 mg/kg Intracatheter To OR Rexene Alberts, MD      . Derrill Memo ON 05/13/2018] vancomycin (VANCOCIN) 1,000 mg in sodium chloride 0.9 % 1,000 mL irrigation   Irrigation To OR Rexene Alberts, MD      . Derrill Memo ON 05/13/2018] vancomycin (VANCOCIN) 1,250 mg in sodium chloride 0.9 % 250 mL IVPB  1,250 mg Intravenous To OR Rexene Alberts, MD          Physical Exam:   BP 136/83   Pulse 74   Resp 20   Ht 5\' 4"  (1.626 m)   Wt 128 lb (58.1 kg)   SpO2 98% Comment: RA  BMI 21.97 kg/m   General:  Well-appearing  Chest:   Clear to auscultation  CV:   Regular rate and rhythm without murmur  Incisions:  n/a  Abdomen:  Soft nontender  Extremities:  Warm and well-perfused  Diagnostic Tests:  Transesophageal Echocardiography  Patient:    Doneen, Ollinger MR #:       [id number] Study Date: 04/24/2018 Gender:     F Age:        13 Height:     167.6 cm Weight:     58.2 kg BSA:        1.64 m^2 Pt. Status: Room:   PERFORMING   Adrian Prows, MD  SONOGRAPHER  Florentina Jenny, RDCS  ADMITTING    Vernell Leep, MD  ATTENDING    Vernell Leep, MD  ORDERING     Vernell Leep, MD  [place], MD  cc:  -------------------------------------------------------------------  ------------------------------------------------------------------- History:   PMH:  Atrial  Myxoma.  ------------------------------------------------------------------- Study Conclusions  - Left ventricle: The cavity size was normal. Wall thickness was   normal. Systolic function was normal. - Left atrium: The atrium was normal in size. - Right atrium: No evidence of thrombus in the atrial cavity or   appendage. - Atrial septum: There was a 2.1 cm (L) x 1.9 cm (W), pedunculated,   minimally mobile mass in the  fossa ovalis; the appearance is   consistent with myxoma.  ------------------------------------------------------------------- Study data:   Study status:  Routine.  Consent:  The risks, benefits, and alternatives to the procedure were explained to the patient and informed consent was obtained.  Procedure:  Initial setup. The patient was brought to the laboratory. Surface ECG leads were monitored. Sedation. Conscious sedation was administered by cardiology staff. Transesophageal echocardiography. An adult multiplane transesophageal probe was inserted by the attending cardiologistwithout difficulty. Image quality was adequate.  Study completion:  The patient tolerated the procedure well. There were no complications.  Administered medications:   Fentanyl, 73mcg, IV.  Midazolam, 5mg , IV.          Diagnostic transesophageal echocardiography.  2D and color Doppler.  Birthdate:  Patient birthdate: Oct 19, 1955.  Age:  Patient is 62 yr old.  Sex:  Gender: female.    BMI: 20.7 kg/m^2.  Blood pressure:     195/100  Patient status:  Outpatient.  Study date:  Study date: 04/24/2018. Study time: 08:18 AM.  Location:  Endoscopy.  -------------------------------------------------------------------  ------------------------------------------------------------------- Left ventricle:  The cavity size was normal. Wall thickness was normal. Systolic function was normal.  ------------------------------------------------------------------- Aortic valve:   Trileaflet; mildly  thickened leaflets. Cusp separation was normal.  Doppler:  There was no regurgitation.  ------------------------------------------------------------------- Aorta:  The aorta was normal, not dilated, and non-diseased.  ------------------------------------------------------------------- Mitral valve:   Structurally normal valve.   Leaflet separation was normal.  Doppler:  There was trivial regurgitation.  ------------------------------------------------------------------- Left atrium:  The atrium was normal in size. There was a large, 2.1 cm (L) x 1.9 cm (W)masson the left side of the interatrial septum; the appearance is consistent with myxoma. The appendage was well visualized and morphologically a left appendage.  ------------------------------------------------------------------- Atrial septum:  There was a 2.1 cm (L) x 1.9 cm (W), pedunculated, minimally mobile mass in the fossa ovalis; the appearance is consistent with myxoma.  ------------------------------------------------------------------- Right ventricle:  The cavity size was normal. Wall thickness was normal. Systolic function was normal.  ------------------------------------------------------------------- Pulmonic valve:    Structurally normal valve.   Cusp separation was normal.  Doppler:  There was trivial regurgitation.  ------------------------------------------------------------------- Pulmonary artery:   The main pulmonary artery was normal-sized.  ------------------------------------------------------------------- Right atrium:  The atrium was normal in size.  No evidence of thrombus in the atrial cavity or appendage.  ------------------------------------------------------------------- Pericardium:  The pericardium was normal in appearance. There was no pericardial effusion.  ------------------------------------------------------------------- Post procedure conclusions Ascending Aorta:  - The  aorta was normal, not dilated, and non-diseased.  ------------------------------------------------------------------- Prepared and Electronically Authenticated by  Adrian Prows, MD 2019-09-06T11:44:30   RIGHT/LEFT HEART CATH AND CORONARY ANGIOGRAPHY  Conclusion   Right and left heart catheterization 04/29/2018: Normal right heart catheterization.  Normal cardiac output and cardiac index, 7.19 and 4.49 by Fick.  Minimal mid LAD 20% stenosis otherwise smooth and normal coronary arteries, right dominant circulation.  Normal LV systolic function, normal LVEDP.    Recommendation: Patient's risk factors are well controlled.  No significant coronary artery disease.  Normal right heart catheterization.  Can proceed with excision of the myxoma with acceptable cardiovascular risk.  Indications   Cardiac myxoma [D15.1 (ICD-10-CM)]  Pre-operative cardiovascular examination [Z01.810 (ICD-10-CM)]  Procedural Details/Technique   Technical Details Procedure performed:  Left heart catheterization including hemodynamic monitoring of the left ventricle, LV gram, selective right and left coronary arteriography. Right heart catheterization and cannulation of cardiac output and cardiac index by Fick.  Indication: Tanae A Neidhardt is  a 62 y.o. female With very large LA myxoma and scheduled for minimally invasive surgery for resection. Left and right heart cath being done pre op prior to CT surgery. She has hypertension and hyperlipidemia as risk factors.  Right AC vein access for right heart and Right radial access for left heart catheterization was utilized for performing the procedure.   Technique:   A 5 French sheath introduced into right right AC vein for access under fluroscopic guidance. A 5 French Swan-Ganz catheter was advanced with balloon inflated under fluoroscopic guidance into first the right atrium followed by the right ventricle and into the pulmonary artery to pulmonary artery wedge position.  Hemodynamics were obtained in a locations.  After hemodynamics were completed, samples were taken for SaO2% measurement to be used in Fick Calculation of cardiac output and cardiac index. The catheter was then pulled back the balloon down and then completely out of the body. Hemostasis obtained by manual pressure over the access site.  Under sterile precautions using a 6 French right radial arterial access, a 6 French sheath was introduced into the right radial artery. A 5 [place] Tig 4 catheter was advanced into the ascending aorta selective right coronary artery and left coronary artery was cannulated and angiography was performed in multiple views. The catheter was pulled back Out of the body over exchange length J-wire. Same catheter was used to perform LV gram which was performed in RAO projection. Catheter exchanged out of the body over J-Wire. NO immediate complications noted. Patient tolerated the procedure well. Contrast used: 50mL.   Estimated blood loss <50 mL.  During this procedure the patient was administered the following to achieve and maintain moderate conscious sedation: Versed 1 mg, Fentanyl 1 mcg, while the patient's heart rate, blood pressure, and oxygen saturation were continuously monitored. The period of conscious sedation was 21 minutes, of which I was present face-to-face 100% of this time.  Coronary Findings   Diagnostic  Dominance: Right  Left Anterior Descending  Prox LAD lesion 20% stenosed  Prox LAD lesion is 20% stenosed.  Left Circumflex  Vessel is angiographically normal.  Right Coronary Artery  Vessel is angiographically normal.  Intervention   No interventions have been documented.  Coronary Diagrams   Diagnostic Diagram       Implants    No implant documentation for this case.  MERGE Images   Show images for CARDIAC CATHETERIZATION   Link to Procedure Log   Procedure Log    Hemo Data    Most Recent Value  Fick Cardiac Output 7.19 L/min    Fick Cardiac Output Index 4.49 (L/min)/BSA  RA A Wave 2 mmHg  RA V Wave 0 mmHg  RA Mean -1 mmHg  RV Systolic Pressure 26 mmHg  RV Diastolic Pressure -1 mmHg  RV EDP 2 mmHg  PA Systolic Pressure 27 mmHg  PA Diastolic Pressure 6 mmHg  PA Mean 14 mmHg  PW A Wave 8 mmHg  PW V Wave 5 mmHg  PW Mean 6 mmHg  AO Systolic Pressure 403 mmHg  AO Diastolic Pressure 70 mmHg  AO Mean 90 mmHg  LV Systolic Pressure 474 mmHg  LV Diastolic Pressure 0 mmHg  LV EDP 2 mmHg  AOp Systolic Pressure 259 mmHg  AOp Diastolic Pressure 74 mmHg  AOp Mean Pressure 96 mmHg  LVp Systolic Pressure 563 mmHg  LVp Diastolic Pressure 0 mmHg  LVp EDP Pressure 4 mmHg  QP/QS 1  TPVR Index 3.11 HRUI  TSVR Index 20.01 HRUI  TPVR/TSVR Ratio 0.16    CT ANGIOGRAPHY CHEST, ABDOMEN AND PELVIS  TECHNIQUE: Multidetector CT imaging through the chest, abdomen and pelvis was performed using the standard protocol during bolus administration of intravenous contrast. Multiplanar reconstructed images and MIPs were obtained and reviewed to evaluate the vascular anatomy.  CONTRAST:  50mL ISOVUE-370 IOPAMIDOL (ISOVUE-370) INJECTION 76%  COMPARISON:  Cardiac MRI 03/14/2018  FINDINGS: CTA CHEST FINDINGS  Cardiovascular: Initial noncontrast enhanced CT imaging demonstrates no evidence of acute intramural hematoma. Following administration of intravenous contrast there is excellent opacification of the thoracic aorta and its branch arteries. The aortic root is normal in caliber. No effacement of the sino-tubular junction. Minimal ectasia of the ascending thoracic aorta without aneurysm. Conventional 3 vessel arch anatomy. Transverse and descending thoracic aorta are normal in caliber. 2.4 x 2.0 cm soft tissue density mass exophytic from the inter atrial septum into the left atrium as seen on prior cardiac MRI. The left atrial appendage is clear without evidence of thrombus formation. The heart is normal in caliber.  Normal caliber main pulmonary artery. No pericardial effusion.  Mediastinum/Nodes: Unremarkable CT appearance of the thyroid gland. No suspicious mediastinal or hilar adenopathy. No soft tissue mediastinal mass. The thoracic esophagus is unremarkable.  Lungs/Pleura: Lungs are clear. No pleural effusion or pneumothorax.  Musculoskeletal: No acute fracture or aggressive appearing lytic or blastic osseous lesion.  Review of the MIP images confirms the above findings.  CTA ABDOMEN AND PELVIS FINDINGS  VASCULAR  Aorta: Normal caliber aorta without aneurysm, dissection, vasculitis or significant stenosis.  Celiac: Patent without evidence of aneurysm, dissection, vasculitis or significant stenosis.  SMA: Patent without evidence of aneurysm, dissection, vasculitis or significant stenosis.  Renals: Both renal arteries are patent without evidence of aneurysm, dissection, vasculitis, fibromuscular dysplasia or significant stenosis.  IMA: Patent without evidence of aneurysm, dissection, vasculitis or significant stenosis.  Inflow: Patent without evidence of aneurysm, dissection, vasculitis or significant stenosis.  Veins: No obvious venous abnormality within the limitations of this arterial phase study.  Review of the MIP images confirms the above findings.  NON-VASCULAR  Hepatobiliary: Normal hepatic contour and morphology. No discrete hepatic lesions. Normal appearance of the gallbladder. No intra or extrahepatic biliary ductal dilatation.  Pancreas: Unremarkable. No pancreatic ductal dilatation or surrounding inflammatory changes.  Spleen: Normal in size without focal abnormality.  Adrenals/Urinary Tract: Normal adrenal glands bilaterally. The left kidney is unremarkable in appearance. Large mildly complex cyst exophytic from the upper pole of the right kidney measures a maximum of 9 cm. There are a few thin internal septations but no evidence  of arterial phase enhancement. No enhancing renal mass, nephrolithiasis or hydronephrosis. Unremarkable ureters and bladder.  Stomach/Bowel: No evidence of obstruction or focal bowel wall thickening. Normal appendix in the right lower quadrant. The terminal ileum is unremarkable.  Lymphatic: No suspicious lymphadenopathy.  Reproductive: Uterus and bilateral adnexa are unremarkable.  Other: No abdominal wall hernia or abnormality. No abdominopelvic ascites.  Musculoskeletal: No acute fracture or aggressive appearing lytic or blastic osseous lesion.  Review of the MIP images confirms the above findings.  IMPRESSION: CTA CHEST  1. 2.4 cm ovoid intracardiac mass within the left atrium arising from the interatrial septum. As noted on the prior cardiac MRI, differential considerations include thrombus and left atrial myxoma. Other primary cardiac neoplasms or metastatic disease are possible but considered highly unlikely. 2. No evidence of thoracic aortic aneurysm, dissection or other acute vascular abnormality.  CTA ABD/PELVIS  1. No evidence of abdominal aortic aneurysm, dissection  or acute vascular pathology. 2. Large approximately 9 cm mildly complex cyst exophytic from the upper pole of the right kidney.  Signed,  Criselda Peaches, MD, [place]  [facility]  [facility]   Electronically Signed   By: Jacqulynn Cadet M.D.   On: 05/06/2018 11:02    Impression:  I have personally reviewed the patient's transesophageal echocardiogram, diagnostic cardiac catheterization, and CT angiogram.  TEE confirmed the presence of a pedunculated mass in the left atrium adherent to the fossa ovalis consistent with atrial myxoma.  There were no other complicating features and left ventricular function appears normal.  Diagnostic cardiac catheterization reveals no significant coronary artery disease.  Right heart  pressures are normal.  CT angiography reveal no contraindication to peripheral cannulation for surgery.   Plan:  I have again reviewed the indications, risk, and potential benefits of elective surgical resection of left atrial myxoma with the patient through her daughter as an interpreter.  Expectations for the patient's postoperative convalescence have been discussed.   The patient understands and accepts all potential risks of surgery including but not limited to risk of death, stroke or other neurologic complication, myocardial infarction, congestive heart failure, respiratory failure, renal failure, bleeding requiring transfusion and/or reexploration, arrhythmia, infection or other wound complications, pneumonia, pleural and/or pericardial effusion, pulmonary embolus, aortic dissection or other major vascular complication, or other delayed complications including recurrence of atrial myxoma.  Alternative surgical approaches have been discussed including a comparison between conventional sternotomy and minimally-invasive techniques.  The relative risks and benefits of each have been reviewed as they pertain to the patient's specific circumstances, and all of their questions have been addressed.  Specific risks potentially related to the minimally-invasive approach were discussed at length, including but not limited to risk of conversion to full or partial sternotomy, aortic dissection or other major vascular complication, unilateral acute lung injury or pulmonary edema, phrenic nerve dysfunction or paralysis, rib fracture, chronic pain, lung hernia, or lymphocele.   All of their questions have been answered.  For OR tomorrow.    I spent in excess of 15 minutes during the conduct of this office consultation and >50% of this time involved direct face-to-face encounter with the patient for counseling and/or coordination of their care.    Valentina Gu. Roxy Manns, MD 05/12/2018 2:50 PM

## 2018-05-12 NOTE — Progress Notes (Addendum)
Anesthesia Chart Review:  Case:  [id number] Date/Time:  05/13/18 0715   Procedures:      MINIMALLY INVASIVE EXCISION OF  LEFT ATRIAL MYXOMA (Left Chest)     TRANSESOPHAGEAL ECHOCARDIOGRAM (TEE) (N/A )   Anesthesia type:  General   Pre-op diagnosis:  Left atrial myxoma   Location:  [facility] OR ROOM 15 / [place] [facility]   Surgeon:  Rexene Alberts, MD      DISCUSSION: 62 yo female  never smoker for above procedure. Pertinent hx includes HLD, Left atrial mass, speaks Hindi.  TEE 04/24/2018 showed normal left ventricular size and function. There was a 2.1 cm (L) x 1.9 cm (W), pedunculated, minimally mobile mass in the fossa ovalis; the appearance is consistent with myxoma. Cath 04/29/2018 showed "No significant coronary artery disease. Normal right heart catheterization.  Can proceed with excision of the myxoma with acceptable cardiovascular risk."  Anticipate she can proceed as planned barring acute status change.  VS: BP 132/71   Pulse 88   Temp 36.6 C   Resp 18   Ht 5\' 4"  (1.626 m)   Wt 58.1 kg   SpO2 100%   BMI 21.99 kg/m    PROVIDERS: Patient, No Pcp Per  Adrian Prows, MD is Cardiologist  LABS: Labs reviewed: Acceptable for surgery. (all labs ordered are listed, but only abnormal results are displayed)  Labs Reviewed  BLOOD GAS, ARTERIAL - Abnormal; Notable for the following components:      Result Value   Acid-Base Excess 3.0 (*)    All other components within normal limits  COMPREHENSIVE METABOLIC PANEL - Abnormal; Notable for the following components:   Glucose, Bld 114 (*)    All other components within normal limits  SURGICAL PCR SCREEN  APTT  CBC  HEMOGLOBIN A1C  PROTIME-INR  URINALYSIS, ROUTINE W REFLEX MICROSCOPIC  TYPE AND SCREEN  ABO/RH     IMAGES: CHEST - 2 VIEW 05/09/2018  COMPARISON:  CT 05/06/2018  FINDINGS: The heart size and mediastinal contours are within normal limits. Both lungs are clear. The visualized skeletal structures  are unremarkable.  IMPRESSION: No active cardiopulmonary disease.   EKG: 04/29/2018: NSR (70bpm)   CV: Carotid US 05/09/2018: Final Interpretation: Right Carotid: Velocities in the right ICA are consistent with a 1-39% stenosis.  Left Carotid: Velocities in the left ICA are consistent with a 1-39% stenosis. Vertebrals: Bilateral vertebral arteries demonstrate antegrade flow. Subclavians: Normal flow hemodynamics were seen in bilateral subclavian       arteries.  Cath 04/29/2018: Right and left heart catheterization 04/29/2018: Normal right heart catheterization.  Normal cardiac output and cardiac index, 7.19 and 4.49 by Fick.  Minimal mid LAD 20% stenosis otherwise smooth and normal coronary arteries, right dominant circulation.  Normal LV systolic function, normal LVEDP.    Recommendation: Patient's risk factors are well controlled.  No significant coronary artery disease.  Normal right heart catheterization.  Can proceed with excision of the myxoma with acceptable cardiovascular risk.  TEE 04/24/2018: Study Conclusions  - Left ventricle: The cavity size was normal. Wall thickness was   normal. Systolic function was normal. - Left atrium: The atrium was normal in size. - Right atrium: No evidence of thrombus in the atrial cavity or   appendage. - Atrial septum: There was a 2.1 cm (L) x 1.9 cm (W), pedunculated,   minimally mobile mass in the fossa ovalis; the appearance is   consistent with myxoma.   Past Medical History:  Diagnosis Date  . Atypical chest  pain   . Hyperlipidemia   . Left atrial mass   . Vitamin D deficiency     Past Surgical History:  Procedure Laterality Date  . CARDIAC CATHETERIZATION    . RIGHT/LEFT HEART CATH AND CORONARY ANGIOGRAPHY N/A 04/29/2018   Procedure: RIGHT/LEFT HEART CATH AND CORONARY ANGIOGRAPHY;  Surgeon: Adrian Prows, MD;  Location: [place] [facility];  Service: Cardiovascular;  Laterality: N/A;  . TEE WITHOUT CARDIOVERSION  N/A 04/24/2018   Procedure: TRANSESOPHAGEAL ECHOCARDIOGRAM (TEE);  Surgeon: Adrian Prows, MD;  Location: [place];  Service: Cardiovascular;  Laterality: N/A;  . TUBAL LIGATION      MEDICATIONS: . atorvastatin (LIPITOR) 40 MG tablet  . Multiple Vitamin (MULTIVITAMIN WITH MINERALS) TABS tablet   No current facility-administered medications for this encounter.      Wynonia Musty [facility] Short Stay Center/Anesthesiology Phone [phone] 05/12/2018 9:10 AM

## 2018-05-12 NOTE — H&P (Signed)
[address]       , [id number]             [phone]          CARDIOTHORACIC SURGERY HISTORY AND PHYSICAL EXAM  Referring Provider is Raelyn Number, MD Primary Cardiologist is Adrian Prows, MD PCP is Raelyn Number, MD      Chief Complaint  Patient presents with  . Atrial Mass    Surgical eval ..Marland KitchenMRI CARDIAC MORPH. 03/14/18..OUTSIDE ECHO 02/26/18    HPI:  Patient is a 62 year old female who has been referred for evaluation of left atrial mass suspicious for possible atrial myxoma.  Patient is originally from [place] but has lived in [place] for more than 15 years.  Approximately 2 years ago she began to experience symptoms of chest discomfort that are somewhat atypical but exacerbated by physical exertion.  Symptoms are described as mild dull pain across her chest that occasionally radiates through to the back.  Symptoms are "always present" but seem to be relieved by rest.  Symptoms have been associated with decreased energy and exertional shortness of breath.  The patient was reportedly evaluated while she was traveling in [place] and told that she had a likely atrial myxoma.  Surgical intervention was recommended but the patient desired to return home for medical therapy.  She was recently seen by her primary care physician and an echocardiogram performed February 26, 2018.  By report the echocardiogram revealed normal left ventricular size and systolic function with an echogenic mass in the left atrium measuring 1.9 x 1.4 cm suspicious for atrial myxoma.  The patient subsequently underwent cardiac gated MRI on March 14, 2018.  This confirmed the presence of a heterogeneous globular nonenhancing mass in the left atrium attached to the intra-atrial septum measuring 2.4 x 2.0 cm.  There was normal left ventricular size and systolic function.  On delayed gadolinium imaging there was no late enhancement.  No other abnormalities were noted and TEE was recommended.   The patient was referred for surgical consultation.  The patient is widowed and lives locally in [place].  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  She lives with 2 of her daughters.  She has remained healthy all of her life and reasonably active physically.  She describes a gradual decline in energy with decreased exercise tolerance.  Appetite is decreased but the patient has not lost weight.  She describes dull pain across her chest which now seems to radiate through to her back.  The pain is exacerbated by physical exertion and partially relieved by rest although the patient states the pain never completely goes away.  She describes mild exertional shortness of breath.  Shortness of breath and discomfort are made worse by laying flat in bed.  She has not had any palpitations, dizzy spells, nor syncope.  She denies any history of lower extremity edema or chronic cough.     Past Medical History:  Diagnosis Date  . Atypical chest pain   . Hyperlipidemia   . Left atrial mass   . Vitamin D deficiency     Past Surgical History:  Procedure Laterality Date  . CARDIAC CATHETERIZATION    . RIGHT/LEFT HEART CATH AND CORONARY ANGIOGRAPHY N/A 04/29/2018   Procedure: RIGHT/LEFT HEART CATH AND CORONARY ANGIOGRAPHY;  Surgeon: Adrian Prows, MD;  Location: [facility];  Service: Cardiovascular;  Laterality: N/A;  . TEE WITHOUT CARDIOVERSION N/A 04/24/2018   Procedure:  TRANSESOPHAGEAL ECHOCARDIOGRAM (TEE);  Surgeon: Adrian Prows, MD;  Location: [facility];  Service: Cardiovascular;  Laterality: N/A;  . TUBAL LIGATION      No family history on file.  Social History Social History   Tobacco Use  . Smoking status: Never Smoker  . Smokeless tobacco: Never Used  Substance Use Topics  . Alcohol use: Never    Frequency: Never  . Drug use: Never    Prior to Admission medications   Medication Sig Start Date End Date Taking? Authorizing Provider  atorvastatin (LIPITOR)  40 MG tablet Take 40 mg by mouth at bedtime.    Yes [provider]  Multiple Vitamin (MULTIVITAMIN WITH MINERALS) TABS tablet Take 1 tablet by mouth daily.   Yes [provider]    No Known Allergies    Review of Systems:              General:                      decreased appetite, decreased energy, no weight gain, no weight loss, no fever             Cardiac:                       + chest pain with exertion, mild chest pain at rest, + SOB with exertion, no resting SOB, no PND, + orthopnea, no palpitations, no arrhythmia, no atrial fibrillation, no LE edema, no dizzy spells, no syncope             Respiratory:                 no shortness of breath, no home oxygen, no productive cough, no dry cough, no bronchitis, no wheezing, no hemoptysis, no asthma, no pain with inspiration or cough, no sleep apnea, no CPAP at night             GI:                               no difficulty swallowing, no reflux, no frequent heartburn, no hiatal hernia, no abdominal pain, + constipation, no diarrhea, no hematochezia, no hematemesis, no melena             GU:                              no dysuria,  no frequency, no urinary tract infection, no hematuria, no kidney stones, no kidney disease             Vascular:                     no pain suggestive of claudication, no pain in feet, no leg cramps, no varicose veins, no DVT, no non-healing foot ulcer             Neuro:                         no stroke, no TIA's, no seizures, no headaches, no temporary blindness one eye,  no slurred speech, no peripheral neuropathy, no chronic pain, no instability of gait, no memory/cognitive dysfunction             Musculoskeletal:         no arthritis, no joint swelling, no myalgias, no  difficulty walking, normal mobility              Skin:                            no rash, no itching, no skin infections, no pressure sores or ulcerations             Psych:                         no anxiety, no  depression, no nervousness, no unusual recent stress             Eyes:                           no blurry vision, no floaters, no recent vision changes, does not wear glasses or contacts             ENT:                            no hearing loss, no loose or painful teeth, full set dentures             Hematologic:               no easy bruising, no abnormal bleeding, no clotting disorder, no frequent epistaxis             Endocrine:                   no diabetes, does not check CBG's at home                           Physical Exam:              BP 134/86 (BP Location: Right Arm, Patient Position: Sitting, Cuff Size: Large)   Pulse 86   Resp 16   Ht 5\' 6"  (1.676 m)   Wt 128 lb (58.1 kg)   SpO2 97% Comment: ON RA  BMI 20.66 kg/m              General:                        well-appearing             HEENT:                       Unremarkable              Neck:                           no JVD, no bruits, no adenopathy              Chest:                          clear to auscultation, symmetrical breath sounds, no wheezes, no rhonchi              CV:                              RRR, no  murmur  Abdomen:                    soft, non-tender, no masses              Extremities:                 warm, well-perfused, pulses diminished but palpable, no LE edema             Rectal/GU                   Deferred             Neuro:                         Grossly non-focal and symmetrical throughout             Skin:                            Clean and dry, no rashes, no breakdown   Diagnostic Tests:  TRANSTHORACIC ECHOCARDIOGRAM  Report from transthoracic echocardiogram performed February 26, 2018 is reviewed.  Images from this report are not currently available for review.  By report the patient had normal left ventricular size and systolic function with ejection fraction estimated 60 to 65%.  There was normal left atrial size with an echogenic mass measuring 1.9 x 1.4 cm seen  attached to the intra-atrial septum and towards the posterior wall of the left atrium, most consistent with atrial myxoma.  There was normal right atrial size.  Atrial septum appeared normal with no evidence for intra-atrial shunt.  The aortic valve appeared normal.  The mitral valve appeared normal with trace mitral regurgitation.  No other significant abnormalities were noted.   CARDIAC MRI  TECHNIQUE: The patient was scanned on a 1.5 Tesla GE magnet. A dedicated cardiac coil was used. Functional imaging was done using Fiesta sequences. 2,3, and 4 chamber views were done to assess for RWMA's. Modified Simpson's rule using a short axis stack was used to calculate an ejection fraction on a dedicated work Conservation officer, nature. The patient received 30 cc of Multihance. After 10 minutes inversion recovery sequences were used to assess for infiltration and scar tissue.  CONTRAST: 30 cc Multihance  FINDINGS: Limited images of the lung fields showed no gross abnormalities.  Normal left ventricular size and wall thickness. Normal wall motion with EF 75%. Mildly dilated RV with normal systolic function. Normal left and right atrial sizes. There was a 2.4 x 2.0 cm heterogeneous globular non-enhancing mass in the left atrium attached to the interatrial septum. Trileaflet aortic valve with no significant regurgitation or stenosis. No significant mitral regurgitation.  On delayed enhancement imaging, there was no myocardial late gadolinium enhancement (LGE).  Measurements:  LVEDV 81 mL  LVSV 60 mL  LVEF 75%  IMPRESSION: 1. Normal LV size with EF 75%, normal wall motion.  2. Mildly dilated RV with normal systolic function.  3. No myocardial LGE so no evidence for prior infarction, infiltrative disease, or myocarditis.  4. 2.4 x 2.2 cm heterogeneous globular non-enhancing mass in the LA attached to the interatrial septum. This could be a left  atrial myxoma, less likely thrombus. Would do TEE to assess more closely.  Dalton Mclean   Electronically Signed By: Loralie Champagne M.D. On: 03/14/2018 17:25   Transesophageal Echocardiography  Patient: Tonia, Avino MR #: [id number] Study Date: 04/24/2018 Gender:  F Age: 37 Height: 167.6 cm Weight: 58.2 kg BSA: 1.64 m^2 Pt. Status: Room:  PERFORMING Adrian Prows, MD SONOGRAPHER Florentina Jenny, RDCS ADMITTING Vernell Leep, MD ATTENDING Vernell Leep, MD ORDERING Vernell Leep, MD [place], MD  cc:  -------------------------------------------------------------------  ------------------------------------------------------------------- History: PMH: Atrial Myxoma.  ------------------------------------------------------------------- Study Conclusions  - Left ventricle: The cavity size was normal. Wall thickness was normal. Systolic function was normal. - Left atrium: The atrium was normal in size. - Right atrium: No evidence of thrombus in the atrial cavity or appendage. - Atrial septum: There was a 2.1 cm (L) x 1.9 cm (W), pedunculated, minimally mobile mass in the fossa ovalis; the appearance is consistent with myxoma.  ------------------------------------------------------------------- Study data: Study status: Routine. Consent: The risks, benefits, and alternatives to the procedure were explained to the patient and informed consent was obtained. Procedure: Initial setup. The patient was brought to the laboratory. Surface ECG leads were monitored. Sedation. Conscious sedation was administered by cardiology staff. Transesophageal echocardiography. An adult multiplane transesophageal probe was inserted by the attending cardiologistwithout difficulty. Image quality was adequate. Study completion: The patient tolerated the procedure well. There  were no complications. Administered medications: Fentanyl, 41mcg, IV. Midazolam, 5mg , IV. Diagnostic transesophageal echocardiography. 2D and color Doppler. Birthdate: Patient birthdate: November 15, 1955. Age: Patient is 62 yr old. Sex: Gender: female. BMI: 20.7 kg/m^2. Blood pressure: 195/100 Patient status: Outpatient. Study date: Study date: 04/24/2018. Study time: 08:18 AM. Location: Endoscopy.  -------------------------------------------------------------------  ------------------------------------------------------------------- Left ventricle: The cavity size was normal. Wall thickness was normal. Systolic function was normal.  ------------------------------------------------------------------- Aortic valve: Trileaflet; mildly thickened leaflets. Cusp separation was normal. Doppler: There was no regurgitation.  ------------------------------------------------------------------- Aorta: The aorta was normal, not dilated, and non-diseased.  ------------------------------------------------------------------- Mitral valve: Structurally normal valve. Leaflet separation was normal. Doppler: There was trivial regurgitation.  ------------------------------------------------------------------- Left atrium: The atrium was normal in size. There was a large, 2.1 cm (L) x 1.9 cm (W)masson the left side of the interatrial septum; the appearance is consistent with myxoma. The appendage was well visualized and morphologically a left appendage.  ------------------------------------------------------------------- Atrial septum: There was a 2.1 cm (L) x 1.9 cm (W), pedunculated, minimally mobile mass in the fossa ovalis; the appearance is consistent with myxoma.  ------------------------------------------------------------------- Right ventricle: The cavity size was normal. Wall thickness was normal. Systolic function was  normal.  ------------------------------------------------------------------- Pulmonic valve: Structurally normal valve. Cusp separation was normal. Doppler: There was trivial regurgitation.  ------------------------------------------------------------------- Pulmonary artery: The main pulmonary artery was normal-sized.  ------------------------------------------------------------------- Right atrium: The atrium was normal in size. No evidence of thrombus in the atrial cavity or appendage.  ------------------------------------------------------------------- Pericardium: The pericardium was normal in appearance. There was no pericardial effusion.  ------------------------------------------------------------------- Post procedure conclusions Ascending Aorta:  - The aorta was normal, not dilated, and non-diseased.  ------------------------------------------------------------------- Prepared and Electronically Authenticated by  Adrian Prows, MD 2019-09-06T11:44:30   RIGHT/LEFT HEART CATH AND CORONARY ANGIOGRAPHY  Conclusion   Right and left heart catheterization 04/29/2018: Normal right heart catheterization. Normal cardiac output and cardiac index, 7.19 and 4.49 by Fick. Minimal mid LAD 20% stenosis otherwise smooth and normal coronary arteries, right dominant circulation. Normal LV systolic function, normal LVEDP.   Recommendation:Patient's risk factors are well controlled. No significant coronary artery disease. Normal right heart catheterization. Can proceed with excision of the myxoma with acceptable cardiovascular risk.  Indications   Cardiac myxoma [D15.1 (ICD-10-CM)]  Pre-operative cardiovascular examination [Z01.810 (ICD-10-CM)]  Procedural Details/Technique   Technical Details Procedure performed:  Left heart catheterization including hemodynamic  monitoring of the left ventricle, LV gram, selective right and left coronary arteriography.  Right heart catheterization and cannulation of cardiac output and cardiac index by Fick.  Indication: Tabatha A Wissink is a 62 y.o. female With very large LA myxoma and scheduled for minimally invasive surgery for resection. Left and right heart cath being done pre op prior to CT surgery. She has hypertension and hyperlipidemia as risk factors.  Right AC vein access for right heart and Right radial access for left heart catheterization was utilized for performing the procedure.   Technique:   A 5 French sheath introduced into right right AC vein for access under fluroscopic guidance. A 5 French Swan-Ganz catheter was advanced with balloon inflated under fluoroscopic guidance into first the right atrium followed by the right ventricle and into the pulmonary artery to pulmonary artery wedge position. Hemodynamics were obtained in a locations.  After hemodynamics were completed, samples were taken for SaO2% measurement to be used in Fick Calculation of cardiac output and cardiac index. The catheter was then pulled back the balloon down and then completely out of the body. Hemostasis obtained by manual pressure over the access site.  Under sterile precautions using a 6 French right radial arterial access, a 6 French sheath was introduced into the right radial artery. A 5 [place] Tig 4 catheter was advanced into the ascending aorta selective right coronary artery and left coronary artery was cannulated and angiography was performed in multiple views. The catheter was pulled back Out of the body over exchange length J-wire. Same catheter was used to perform LV gram which was performed in RAO projection. Catheter exchanged out of the body over J-Wire. NO immediate complications noted. Patient tolerated the procedure well. Contrast used: 61mL.   Estimated blood loss <50 mL.  During this procedure the patient was administered the following to achieve and maintain moderate conscious sedation: Versed 1 mg, Fentanyl  1 mcg, while the patient's heart rate, blood pressure, and oxygen saturation were continuously monitored. The period of conscious sedation was 21 minutes, of which I was present face-to-face 100% of this time.  Coronary Findings   Diagnostic  Dominance: Right  Left Anterior Descending  Prox LAD lesion 20% stenosed  Prox LAD lesion is 20% stenosed.  Left Circumflex  Vessel is angiographically normal.  Right Coronary Artery  Vessel is angiographically normal.  Intervention   No interventions have been documented.  Coronary Diagrams   Diagnostic Diagram       Implants       No implant documentation for this case.  MERGE Images   Show images for CARDIAC CATHETERIZATION   Link to Procedure Log   Procedure Log    Hemo Data    Most Recent Value  Fick Cardiac Output 7.19 L/min  Fick Cardiac Output Index 4.49 (L/min)/BSA  RA A Wave 2 mmHg  RA V Wave 0 mmHg  RA Mean -1 mmHg  RV Systolic Pressure 26 mmHg  RV Diastolic Pressure -1 mmHg  RV EDP 2 mmHg  PA Systolic Pressure 27 mmHg  PA Diastolic Pressure 6 mmHg  PA Mean 14 mmHg  PW A Wave 8 mmHg  PW V Wave 5 mmHg  PW Mean 6 mmHg  AO Systolic Pressure 253 mmHg  AO Diastolic Pressure 70 mmHg  AO Mean 90 mmHg  LV Systolic Pressure 664 mmHg  LV Diastolic Pressure 0 mmHg  LV EDP 2 mmHg  AOp Systolic Pressure 403 mmHg  AOp Diastolic Pressure 74 mmHg  AOp Mean  Pressure 96 mmHg  LVp Systolic Pressure 263 mmHg  LVp Diastolic Pressure 0 mmHg  LVp EDP Pressure 4 mmHg  QP/QS 1  TPVR Index 3.11 HRUI  TSVR Index 20.01 HRUI  TPVR/TSVR Ratio 0.16    CT ANGIOGRAPHY CHEST, ABDOMEN AND PELVIS  TECHNIQUE: Multidetector CT imaging through the chest, abdomen and pelvis was performed using the standard protocol during bolus administration of intravenous contrast. Multiplanar reconstructed images and MIPs were obtained and reviewed to evaluate the vascular anatomy.  CONTRAST: 40mL ISOVUE-370 IOPAMIDOL (ISOVUE-370)  INJECTION 76%  COMPARISON: Cardiac MRI 03/14/2018  FINDINGS: CTA CHEST FINDINGS  Cardiovascular: Initial noncontrast enhanced CT imaging demonstrates no evidence of acute intramural hematoma. Following administration of intravenous contrast there is excellent opacification of the thoracic aorta and its branch arteries. The aortic root is normal in caliber. No effacement of the sino-tubular junction. Minimal ectasia of the ascending thoracic aorta without aneurysm. Conventional 3 vessel arch anatomy. Transverse and descending thoracic aorta are normal in caliber. 2.4 x 2.0 cm soft tissue density mass exophytic from the inter atrial septum into the left atrium as seen on prior cardiac MRI. The left atrial appendage is clear without evidence of thrombus formation. The heart is normal in caliber. Normal caliber main pulmonary artery. No pericardial effusion.  Mediastinum/Nodes: Unremarkable CT appearance of the thyroid gland. No suspicious mediastinal or hilar adenopathy. No soft tissue mediastinal mass. The thoracic esophagus is unremarkable.  Lungs/Pleura: Lungs are clear. No pleural effusion or pneumothorax.  Musculoskeletal: No acute fracture or aggressive appearing lytic or blastic osseous lesion.  Review of the MIP images confirms the above findings.  CTA ABDOMEN AND PELVIS FINDINGS  VASCULAR  Aorta: Normal caliber aorta without aneurysm, dissection, vasculitis or significant stenosis.  Celiac: Patent without evidence of aneurysm, dissection, vasculitis or significant stenosis.  SMA: Patent without evidence of aneurysm, dissection, vasculitis or significant stenosis.  Renals: Both renal arteries are patent without evidence of aneurysm, dissection, vasculitis, fibromuscular dysplasia or significant stenosis.  IMA: Patent without evidence of aneurysm, dissection, vasculitis or significant stenosis.  Inflow: Patent without evidence of aneurysm,  dissection, vasculitis or significant stenosis.  Veins: No obvious venous abnormality within the limitations of this arterial phase study.  Review of the MIP images confirms the above findings.  NON-VASCULAR  Hepatobiliary: Normal hepatic contour and morphology. No discrete hepatic lesions. Normal appearance of the gallbladder. No intra or extrahepatic biliary ductal dilatation.  Pancreas: Unremarkable. No pancreatic ductal dilatation or surrounding inflammatory changes.  Spleen: Normal in size without focal abnormality.  Adrenals/Urinary Tract: Normal adrenal glands bilaterally. The left kidney is unremarkable in appearance. Large mildly complex cyst exophytic from the upper pole of the right kidney measures a maximum of 9 cm. There are a few thin internal septations but no evidence of arterial phase enhancement. No enhancing renal mass, nephrolithiasis or hydronephrosis. Unremarkable ureters and bladder.  Stomach/Bowel: No evidence of obstruction or focal bowel wall thickening. Normal appendix in the right lower quadrant. The terminal ileum is unremarkable.  Lymphatic: No suspicious lymphadenopathy.  Reproductive: Uterus and bilateral adnexa are unremarkable.  Other: No abdominal wall hernia or abnormality. No abdominopelvic ascites.  Musculoskeletal: No acute fracture or aggressive appearing lytic or blastic osseous lesion.  Review of the MIP images confirms the above findings.  IMPRESSION: CTA CHEST  1. 2.4 cm ovoid intracardiac mass within the left atrium arising from the interatrial septum. As noted on the prior cardiac MRI, differential considerations include thrombus and left atrial myxoma. Other primary cardiac neoplasms or  metastatic disease are possible but considered highly unlikely. 2. No evidence of thoracic aortic aneurysm, dissection or other acute vascular abnormality.  CTA ABD/PELVIS  1. No evidence of abdominal aortic  aneurysm, dissection or acute vascular pathology. 2. Large approximately 9 cm mildly complex cyst exophytic from the upper pole of the right kidney.  Signed,  Criselda Peaches, MD, [place]  [facility]  [facility]   Electronically Signed By: Jacqulynn Cadet M.D. On: 05/06/2018 11:02    Impression:  I have personally reviewed the patient's transesophageal echocardiogram, diagnostic cardiac catheterization, and CT angiogram.  TEE confirmed the presence of a pedunculated mass in the left atrium adherent to the fossa ovalis consistent with atrial myxoma.  There were no other complicating features and left ventricular function appears normal.  Diagnostic cardiac catheterization reveals no significant coronary artery disease.  Right heart pressures are normal.  CT angiography reveal no contraindication to peripheral cannulation for surgery.   Plan:  I have again reviewed the indications, risk, and potential benefits of elective surgical resection of left atrial myxoma with the patient through her daughter as an interpreter.  Expectations for the patient's postoperative convalescence have been discussed.   The patient understands and accepts all potential risks of surgery including but not limited to risk of death, stroke or other neurologic complication, myocardial infarction, congestive heart failure, respiratory failure, renal failure, bleeding requiring transfusion and/or reexploration, arrhythmia, infection or other wound complications, pneumonia, pleural and/or pericardial effusion, pulmonary embolus, aortic dissection or other major vascular complication, or other delayed complications including recurrence of atrial myxoma.  Alternative surgical approaches have been discussed including a comparison between conventional sternotomy and minimally-invasive techniques.  The relative risks and benefits of each have been reviewed  as they pertain to the patient's specific circumstances, and all of their questions have been addressed.  Specific risks potentially related to the minimally-invasive approach were discussed at length, including but not limited to risk of conversion to full or partial sternotomy, aortic dissection or other major vascular complication, unilateral acute lung injury or pulmonary edema, phrenic nerve dysfunction or paralysis, rib fracture, chronic pain, lung hernia, or lymphocele.   All of their questions have been answered.  For OR tomorrow.     Valentina Gu. Roxy Manns, MD 05/12/2018 2:50 PM

## 2018-05-12 NOTE — Anesthesia Preprocedure Evaluation (Addendum)
Anesthesia Evaluation  Patient identified by MRN, date of birth, ID band Patient awake    Reviewed: Allergy & Precautions, H&P , NPO status , Patient's Chart, lab work & pertinent test results  Airway Mallampati: II  TM Distance: >3 FB Neck ROM: Full    Dental no notable dental hx. (+) Edentulous Upper, Edentulous Lower, Dental Advisory Given   Pulmonary neg pulmonary ROS,    Pulmonary exam normal breath sounds clear to auscultation       Cardiovascular Exercise Tolerance: Good negative cardio ROS   Rhythm:Regular Rate:Normal     Neuro/Psych negative neurological ROS  negative psych ROS   GI/Hepatic negative GI ROS, Neg liver ROS,   Endo/Other  negative endocrine ROS  Renal/GU negative Renal ROS  negative genitourinary   Musculoskeletal   Abdominal   Peds  Hematology negative hematology ROS (+)   Anesthesia Other Findings   Reproductive/Obstetrics negative OB ROS                            Anesthesia Physical Anesthesia Plan  ASA: II  Anesthesia Plan: General   Post-op Pain Management:    Induction: Intravenous  PONV Risk Score and Plan: 3 and Midazolam, Treatment may vary due to age or medical condition, Ondansetron and Dexamethasone  Airway Management Planned: Double Lumen EBT  Additional Equipment: Arterial line, CVP, PA Cath, TEE and Ultrasound Guidance Line Placement  Intra-op Plan:   Post-operative Plan: Post-operative intubation/ventilation  Informed Consent: I have reviewed the patients History and Physical, chart, labs and discussed the procedure including the risks, benefits and alternatives for the proposed anesthesia with the patient or authorized representative who has indicated his/her understanding and acceptance.   Dental advisory given  Plan Discussed with: CRNA  Anesthesia Plan Comments:         Anesthesia Quick Evaluation

## 2018-05-13 ENCOUNTER — Inpatient Hospital Stay (HOSPITAL_COMMUNITY): Payer: BLUE CROSS/BLUE SHIELD

## 2018-05-13 ENCOUNTER — Other Ambulatory Visit (HOSPITAL_COMMUNITY): Payer: BLUE CROSS/BLUE SHIELD

## 2018-05-13 ENCOUNTER — Inpatient Hospital Stay (HOSPITAL_COMMUNITY)
Admission: RE | Admit: 2018-05-13 | Discharge: 2018-05-19 | DRG: 229 | Disposition: A | Discharge status: Home / Self Care | Payer: BLUE CROSS/BLUE SHIELD | Attending: Thoracic Surgery (Cardiothoracic Vascular Surgery) | Admitting: Thoracic Surgery (Cardiothoracic Vascular Surgery)

## 2018-05-13 ENCOUNTER — Other Ambulatory Visit: Payer: Self-pay

## 2018-05-13 ENCOUNTER — Encounter (HOSPITAL_COMMUNITY): Payer: Self-pay

## 2018-05-13 ENCOUNTER — Encounter (HOSPITAL_COMMUNITY)
Admission: RE | Disposition: A | Discharge status: Home / Self Care | Payer: Self-pay | Source: Home / Self Care | Attending: Thoracic Surgery (Cardiothoracic Vascular Surgery)

## 2018-05-13 ENCOUNTER — Inpatient Hospital Stay (HOSPITAL_COMMUNITY): Admission: RE | Admit: 2018-05-13 | Payer: BLUE CROSS/BLUE SHIELD | Source: Ambulatory Visit

## 2018-05-13 ENCOUNTER — Inpatient Hospital Stay (HOSPITAL_COMMUNITY): Payer: BLUE CROSS/BLUE SHIELD | Admitting: Certified Registered Nurse Anesthetist

## 2018-05-13 DIAGNOSIS — Z86018 Personal history of other benign neoplasm: Secondary | ICD-10-CM

## 2018-05-13 DIAGNOSIS — E785 Hyperlipidemia, unspecified: Secondary | ICD-10-CM | POA: Diagnosis not present

## 2018-05-13 DIAGNOSIS — J9811 Atelectasis: Secondary | ICD-10-CM | POA: Diagnosis not present

## 2018-05-13 DIAGNOSIS — D151 Benign neoplasm of heart: Principal | ICD-10-CM | POA: Diagnosis present

## 2018-05-13 DIAGNOSIS — Z9851 Tubal ligation status: Secondary | ICD-10-CM | POA: Diagnosis not present

## 2018-05-13 DIAGNOSIS — D62 Acute posthemorrhagic anemia: Secondary | ICD-10-CM | POA: Diagnosis not present

## 2018-05-13 DIAGNOSIS — K08109 Complete loss of teeth, unspecified cause, unspecified class: Secondary | ICD-10-CM | POA: Diagnosis not present

## 2018-05-13 DIAGNOSIS — I10 Essential (primary) hypertension: Secondary | ICD-10-CM | POA: Diagnosis present

## 2018-05-13 DIAGNOSIS — Z4682 Encounter for fitting and adjustment of non-vascular catheter: Secondary | ICD-10-CM

## 2018-05-13 DIAGNOSIS — R222 Localized swelling, mass and lump, trunk: Secondary | ICD-10-CM

## 2018-05-13 DIAGNOSIS — D219 Benign neoplasm of connective and other soft tissue, unspecified: Secondary | ICD-10-CM

## 2018-05-13 HISTORY — PX: TEE WITHOUT CARDIOVERSION: SHX5443

## 2018-05-13 HISTORY — DX: Personal history of other benign neoplasm: Z86.018

## 2018-05-13 HISTORY — PX: MINIMALLY INVASIVE EXCISION OF ATRIAL MYXOMA: SHX5974

## 2018-05-13 LAB — POCT I-STAT, CHEM 8
BUN: 7 mg/dL — AB (ref 8–23)
BUN: 6 mg/dL — AB (ref 8–23)
BUN: 5 mg/dL — ABNORMAL LOW (ref 8–23)
BUN: 5 mg/dL — AB (ref 8–23)
BUN: 5 mg/dL — AB (ref 8–23)
CALCIUM ION: 1.16 mmol/L (ref 1.15–1.40)
CALCIUM ION: 0.91 mmol/L — AB (ref 1.15–1.40)
CALCIUM ION: 1.05 mmol/L — AB (ref 1.15–1.40)
CHLORIDE: 106 mmol/L (ref 98–111)
CHLORIDE: 107 mmol/L (ref 98–111)
CREATININE: 0.3 mg/dL — AB (ref 0.44–1.00)
CREATININE: 0.4 mg/dL — AB (ref 0.44–1.00)
CREATININE: 0.3 mg/dL — AB (ref 0.44–1.00)
CREATININE: 0.3 mg/dL — AB (ref 0.44–1.00)
Calcium, Ion: 1.23 mmol/L (ref 1.15–1.40)
Calcium, Ion: 1.02 mmol/L — ABNORMAL LOW (ref 1.15–1.40)
Chloride: 99 mmol/L (ref 98–111)
Chloride: 104 mmol/L (ref 98–111)
Chloride: 107 mmol/L (ref 98–111)
Creatinine, Ser: 0.2 mg/dL — ABNORMAL LOW (ref 0.44–1.00)
GLUCOSE: 107 mg/dL — AB (ref 70–99)
GLUCOSE: 142 mg/dL — AB (ref 70–99)
GLUCOSE: 119 mg/dL — AB (ref 70–99)
GLUCOSE: 138 mg/dL — AB (ref 70–99)
GLUCOSE: 115 mg/dL — AB (ref 70–99)
HCT: 33 % — ABNORMAL LOW (ref 36.0–46.0)
HCT: 28 % — ABNORMAL LOW (ref 36.0–46.0)
HCT: 26 % — ABNORMAL LOW (ref 36.0–46.0)
HCT: 26 % — ABNORMAL LOW (ref 36.0–46.0)
HEMATOCRIT: 27 % — AB (ref 36.0–46.0)
HEMOGLOBIN: 11.2 g/dL — AB (ref 12.0–15.0)
HEMOGLOBIN: 8.8 g/dL — AB (ref 12.0–15.0)
Hemoglobin: 9.5 g/dL — ABNORMAL LOW (ref 12.0–15.0)
Hemoglobin: 8.8 g/dL — ABNORMAL LOW (ref 12.0–15.0)
Hemoglobin: 9.2 g/dL — ABNORMAL LOW (ref 12.0–15.0)
POTASSIUM: 3.7 mmol/L (ref 3.5–5.1)
Potassium: 3.7 mmol/L (ref 3.5–5.1)
Potassium: 3.9 mmol/L (ref 3.5–5.1)
Potassium: 4.7 mmol/L (ref 3.5–5.1)
Potassium: 3.7 mmol/L (ref 3.5–5.1)
SODIUM: 137 mmol/L (ref 135–145)
SODIUM: 137 mmol/L (ref 135–145)
Sodium: 142 mmol/L (ref 135–145)
Sodium: 140 mmol/L (ref 135–145)
Sodium: 140 mmol/L (ref 135–145)
TCO2: 27 mmol/L (ref 22–32)
TCO2: 26 mmol/L (ref 22–32)
TCO2: 24 mmol/L (ref 22–32)
TCO2: 26 mmol/L (ref 22–32)
TCO2: 23 mmol/L (ref 22–32)

## 2018-05-13 LAB — GLUCOSE, CAPILLARY
GLUCOSE-CAPILLARY: 68 mg/dL — AB (ref 70–99)
GLUCOSE-CAPILLARY: 155 mg/dL — AB (ref 70–99)
GLUCOSE-CAPILLARY: 146 mg/dL — AB (ref 70–99)
GLUCOSE-CAPILLARY: 106 mg/dL — AB (ref 70–99)
GLUCOSE-CAPILLARY: 150 mg/dL — AB (ref 70–99)
Glucose-Capillary: 123 mg/dL — ABNORMAL HIGH (ref 70–99)
Glucose-Capillary: 134 mg/dL — ABNORMAL HIGH (ref 70–99)
Glucose-Capillary: 121 mg/dL — ABNORMAL HIGH (ref 70–99)
Glucose-Capillary: 125 mg/dL — ABNORMAL HIGH (ref 70–99)
Glucose-Capillary: 128 mg/dL — ABNORMAL HIGH (ref 70–99)

## 2018-05-13 LAB — POCT I-STAT 3, ART BLOOD GAS (G3+)
Acid-base deficit: 1 mmol/L (ref 0.0–2.0)
Acid-base deficit: 2 mmol/L (ref 0.0–2.0)
Acid-base deficit: 2 mmol/L (ref 0.0–2.0)
BICARBONATE: 25.2 mmol/L (ref 20.0–28.0)
BICARBONATE: 23.1 mmol/L (ref 20.0–28.0)
Bicarbonate: 25.4 mmol/L (ref 20.0–28.0)
Bicarbonate: 23.7 mmol/L (ref 20.0–28.0)
O2 SAT: 100 %
O2 SAT: 100 %
O2 Saturation: 97 %
O2 Saturation: 94 %
PCO2 ART: 42 mmHg (ref 32.0–48.0)
PO2 ART: 279 mmHg — AB (ref 83.0–108.0)
PO2 ART: 100 mmHg (ref 83.0–108.0)
PO2 ART: 71 mmHg — AB (ref 83.0–108.0)
Patient temperature: 35.6
TCO2: 27 mmol/L (ref 22–32)
TCO2: 24 mmol/L (ref 22–32)
TCO2: 27 mmol/L (ref 22–32)
TCO2: 25 mmol/L (ref 22–32)
pCO2 arterial: 45.4 mmHg (ref 32.0–48.0)
pCO2 arterial: 34.5 mmHg (ref 32.0–48.0)
pCO2 arterial: 50.9 mmHg — ABNORMAL HIGH (ref 32.0–48.0)
pH, Arterial: 7.352 (ref 7.350–7.450)
pH, Arterial: 7.433 (ref 7.350–7.450)
pH, Arterial: 7.299 — ABNORMAL LOW (ref 7.350–7.450)
pH, Arterial: 7.354 (ref 7.350–7.450)
pO2, Arterial: 415 mmHg — ABNORMAL HIGH (ref 83.0–108.0)

## 2018-05-13 LAB — CBC
HEMATOCRIT: 28.9 % — AB (ref 36.0–46.0)
Hemoglobin: 9.6 g/dL — ABNORMAL LOW (ref 12.0–15.0)
MCH: 29.4 pg (ref 26.0–34.0)
MCHC: 33.2 g/dL (ref 30.0–36.0)
MCV: 88.4 fL (ref 78.0–100.0)
PLATELETS: 150 10*3/uL (ref 150–400)
RBC: 3.27 MIL/uL — AB (ref 3.87–5.11)
RDW: 12.1 % (ref 11.5–15.5)
WBC: 13.9 10*3/uL — ABNORMAL HIGH (ref 4.0–10.5)

## 2018-05-13 LAB — PREPARE RBC (CROSSMATCH)

## 2018-05-13 LAB — PROTIME-INR
INR: 1.38
Prothrombin Time: 16.9 seconds — ABNORMAL HIGH (ref 11.4–15.2)

## 2018-05-13 LAB — POCT I-STAT 4, (NA,K, GLUC, HGB,HCT)
Glucose, Bld: 87 mg/dL (ref 70–99)
HCT: 24 % — ABNORMAL LOW (ref 36.0–46.0)
Hemoglobin: 8.2 g/dL — ABNORMAL LOW (ref 12.0–15.0)
Potassium: 3.7 mmol/L (ref 3.5–5.1)
SODIUM: 143 mmol/L (ref 135–145)

## 2018-05-13 LAB — APTT: APTT: 41 s — AB (ref 24–36)

## 2018-05-13 LAB — HEMOGLOBIN AND HEMATOCRIT, BLOOD
HEMATOCRIT: 22.2 % — AB (ref 36.0–46.0)
Hemoglobin: 7.5 g/dL — ABNORMAL LOW (ref 12.0–15.0)

## 2018-05-13 LAB — PLATELET COUNT: Platelets: 146 10*3/uL — ABNORMAL LOW (ref 150–400)

## 2018-05-13 SURGERY — EXCISION, MYXOMA, CARDIAC ATRIUM, MINIMALLY INVASIVE
Anesthesia: General | Site: Chest

## 2018-05-13 MED ORDER — ACETAMINOPHEN 500 MG PO TABS
1000.0000 mg | ORAL_TABLET | Freq: Four times a day (QID) | ORAL | Status: AC
  Administered 2018-05-13 – 2018-05-17 (×14): 1000 mg via ORAL
  Filled 2018-05-13 (×14): qty 2

## 2018-05-13 MED ORDER — PROPOFOL 10 MG/ML IV BOLUS
INTRAVENOUS | Status: AC
  Filled 2018-05-13: qty 20

## 2018-05-13 MED ORDER — ACETAMINOPHEN 650 MG RE SUPP: 650.0000 mg | Freq: Once | RECTAL | Status: DC

## 2018-05-13 MED ORDER — ALBUMIN HUMAN 5 % IV SOLN
250.0000 mL | INTRAVENOUS | Status: AC | PRN
  Administered 2018-05-13: 12.5 g via INTRAVENOUS
  Filled 2018-05-13: qty 250

## 2018-05-13 MED ORDER — CHLORHEXIDINE GLUCONATE 4 % EX LIQD: 30.0000 mL | CUTANEOUS | Status: DC

## 2018-05-13 MED ORDER — LACTATED RINGERS IV SOLN
INTRAVENOUS | Status: DC
  Administered 2018-05-13: 13:00:00 via INTRAVENOUS

## 2018-05-13 MED ORDER — FENTANYL CITRATE (PF) 250 MCG/5ML IJ SOLN
INTRAMUSCULAR | Status: DC | PRN
  Administered 2018-05-13: 100 ug via INTRAVENOUS
  Administered 2018-05-13: 450 ug via INTRAVENOUS
  Administered 2018-05-13: 100 ug via INTRAVENOUS
  Administered 2018-05-13 (×2): 50 ug via INTRAVENOUS
  Administered 2018-05-13: 100 ug via INTRAVENOUS

## 2018-05-13 MED ORDER — ASPIRIN 81 MG PO CHEW: 324.0000 mg | CHEWABLE_TABLET | Freq: Every day | ORAL | Status: DC

## 2018-05-13 MED ORDER — INFLUENZA VAC SPLIT QUAD 0.5 ML IM SUSY
0.5000 mL | PREFILLED_SYRINGE | INTRAMUSCULAR | Status: DC
  Filled 2018-05-13: qty 0.5

## 2018-05-13 MED ORDER — ALBUMIN HUMAN 5 % IV SOLN
INTRAVENOUS | Status: DC | PRN
  Administered 2018-05-13: 12:00:00 via INTRAVENOUS

## 2018-05-13 MED ORDER — DEXAMETHASONE SODIUM PHOSPHATE 10 MG/ML IJ SOLN
INTRAMUSCULAR | Status: AC
  Filled 2018-05-13: qty 1

## 2018-05-13 MED ORDER — LACTATED RINGERS IV SOLN
INTRAVENOUS | Status: DC | PRN
  Administered 2018-05-13: 07:00:00 via INTRAVENOUS

## 2018-05-13 MED ORDER — DEXTROSE 50 % IV SOLN
INTRAVENOUS | Status: AC
  Filled 2018-05-13: qty 50

## 2018-05-13 MED ORDER — OXYCODONE HCL 5 MG PO TABS
5.0000 mg | ORAL_TABLET | ORAL | Status: DC | PRN
  Administered 2018-05-13: 5 mg via ORAL
  Administered 2018-05-13 – 2018-05-18 (×13): 10 mg via ORAL
  Filled 2018-05-13 (×8): qty 2
  Filled 2018-05-13: qty 1
  Filled 2018-05-13 (×5): qty 2

## 2018-05-13 MED ORDER — DEXTROSE 50 % IV SOLN
13.0000 mL | Freq: Once | INTRAVENOUS | Status: AC
  Administered 2018-05-13: 13 mL via INTRAVENOUS

## 2018-05-13 MED ORDER — BISACODYL 10 MG RE SUPP
10.0000 mg | Freq: Every day | RECTAL | Status: DC
  Filled 2018-05-13: qty 1

## 2018-05-13 MED ORDER — PANTOPRAZOLE SODIUM 40 MG PO TBEC
40.0000 mg | DELAYED_RELEASE_TABLET | Freq: Every day | ORAL | Status: DC
  Administered 2018-05-15 – 2018-05-19 (×5): 40 mg via ORAL
  Filled 2018-05-13 (×5): qty 1

## 2018-05-13 MED ORDER — CHLORHEXIDINE GLUCONATE 0.12 % MT SOLN
15.0000 mL | Freq: Once | OROMUCOSAL | Status: AC
  Administered 2018-05-13: 15 mL via OROMUCOSAL
  Filled 2018-05-13: qty 15

## 2018-05-13 MED ORDER — SODIUM CHLORIDE 0.9 % IV SOLN: INTRAVENOUS | Status: DC

## 2018-05-13 MED ORDER — SODIUM CHLORIDE 0.9 % IV SOLN: INTRAVENOUS | Status: AC

## 2018-05-13 MED ORDER — SODIUM CHLORIDE 0.45 % IV SOLN: INTRAVENOUS | Status: DC | PRN

## 2018-05-13 MED ORDER — BUPIVACAINE 0.5 % ON-Q PUMP SINGLE CATH 400 ML
INJECTION | Status: DC | PRN
  Administered 2018-05-13: 400 mL

## 2018-05-13 MED ORDER — VANCOMYCIN HCL IN DEXTROSE 1-5 GM/200ML-% IV SOLN
1000.0000 mg | Freq: Once | INTRAVENOUS | Status: AC
  Administered 2018-05-13: 1000 mg via INTRAVENOUS
  Filled 2018-05-13 (×2): qty 200

## 2018-05-13 MED ORDER — DEXMEDETOMIDINE HCL IN NACL 200 MCG/50ML IV SOLN
0.0000 ug/kg/h | INTRAVENOUS | Status: DC
  Filled 2018-05-13: qty 50

## 2018-05-13 MED ORDER — MIDAZOLAM HCL 10 MG/2ML IJ SOLN
INTRAMUSCULAR | Status: AC
  Filled 2018-05-13: qty 2

## 2018-05-13 MED ORDER — NITROGLYCERIN IN D5W 200-5 MCG/ML-% IV SOLN: 0.0000 ug/min | INTRAVENOUS | Status: DC

## 2018-05-13 MED ORDER — PROPOFOL 10 MG/ML IV BOLUS
INTRAVENOUS | Status: DC | PRN
  Administered 2018-05-13: 80 mg via INTRAVENOUS

## 2018-05-13 MED ORDER — POTASSIUM CHLORIDE 10 MEQ/50ML IV SOLN
10.0000 meq | INTRAVENOUS | Status: AC
  Administered 2018-05-13 (×3): 10 meq via INTRAVENOUS
  Filled 2018-05-13: qty 50

## 2018-05-13 MED ORDER — FAMOTIDINE IN NACL 20-0.9 MG/50ML-% IV SOLN
20.0000 mg | Freq: Two times a day (BID) | INTRAVENOUS | Status: DC
  Administered 2018-05-13: 20 mg via INTRAVENOUS
  Filled 2018-05-13: qty 50

## 2018-05-13 MED ORDER — SUGAMMADEX SODIUM 200 MG/2ML IV SOLN
INTRAVENOUS | Status: DC | PRN
  Administered 2018-05-13: 150 mg via INTRAVENOUS

## 2018-05-13 MED ORDER — SODIUM CHLORIDE 0.9% FLUSH: 3.0000 mL | INTRAVENOUS | Status: DC | PRN

## 2018-05-13 MED ORDER — INSULIN REGULAR BOLUS VIA INFUSION
0.0000 [IU] | Freq: Three times a day (TID) | INTRAVENOUS | Status: DC
  Filled 2018-05-13: qty 10

## 2018-05-13 MED ORDER — HEPARIN SODIUM (PORCINE) 1000 UNIT/ML IJ SOLN
INTRAMUSCULAR | Status: DC | PRN
  Administered 2018-05-13: 10000 [IU] via INTRAVENOUS
  Administered 2018-05-13: 21000 [IU] via INTRAVENOUS

## 2018-05-13 MED ORDER — MIDAZOLAM HCL 5 MG/5ML IJ SOLN
INTRAMUSCULAR | Status: DC | PRN
  Administered 2018-05-13: 2 mg via INTRAVENOUS
  Administered 2018-05-13: 1 mg via INTRAVENOUS

## 2018-05-13 MED ORDER — LACTATED RINGERS IV SOLN: INTRAVENOUS | Status: DC | PRN

## 2018-05-13 MED ORDER — ASPIRIN EC 325 MG PO TBEC
325.0000 mg | DELAYED_RELEASE_TABLET | Freq: Every day | ORAL | Status: DC
  Administered 2018-05-14 – 2018-05-19 (×6): 325 mg via ORAL
  Filled 2018-05-13 (×6): qty 1

## 2018-05-13 MED ORDER — PHENYLEPHRINE 40 MCG/ML (10ML) SYRINGE FOR IV PUSH (FOR BLOOD PRESSURE SUPPORT)
PREFILLED_SYRINGE | INTRAVENOUS | Status: AC
  Filled 2018-05-13: qty 10

## 2018-05-13 MED ORDER — MAGNESIUM SULFATE 4 GM/100ML IV SOLN
4.0000 g | Freq: Once | INTRAVENOUS | Status: AC
  Administered 2018-05-13: 4 g via INTRAVENOUS
  Filled 2018-05-13: qty 100

## 2018-05-13 MED ORDER — PHENYLEPHRINE HCL-NACL 20-0.9 MG/250ML-% IV SOLN
0.0000 ug/min | INTRAVENOUS | Status: DC
  Filled 2018-05-13: qty 250

## 2018-05-13 MED ORDER — LACTATED RINGERS IV SOLN: INTRAVENOUS | Status: DC

## 2018-05-13 MED ORDER — ROCURONIUM BROMIDE 10 MG/ML (PF) SYRINGE
PREFILLED_SYRINGE | INTRAVENOUS | Status: DC | PRN
  Administered 2018-05-13: 20 mg via INTRAVENOUS
  Administered 2018-05-13: 30 mg via INTRAVENOUS
  Administered 2018-05-13: 50 mg via INTRAVENOUS
  Administered 2018-05-13: 20 mg via INTRAVENOUS

## 2018-05-13 MED ORDER — BUPIVACAINE 0.5 % ON-Q PUMP SINGLE CATH 400 ML
400.0000 mL | INJECTION | Status: DC
  Filled 2018-05-13: qty 400

## 2018-05-13 MED ORDER — BISACODYL 5 MG PO TBEC
10.0000 mg | DELAYED_RELEASE_TABLET | Freq: Every day | ORAL | Status: DC
  Administered 2018-05-14 – 2018-05-19 (×5): 10 mg via ORAL
  Filled 2018-05-13 (×5): qty 2

## 2018-05-13 MED ORDER — ONDANSETRON HCL 4 MG/2ML IJ SOLN
4.0000 mg | Freq: Four times a day (QID) | INTRAMUSCULAR | Status: DC | PRN
  Administered 2018-05-14 – 2018-05-15 (×2): 4 mg via INTRAVENOUS
  Filled 2018-05-13 (×2): qty 2

## 2018-05-13 MED ORDER — ACETAMINOPHEN 160 MG/5ML PO SOLN: 1000.0000 mg | Freq: Four times a day (QID) | ORAL | Status: DC

## 2018-05-13 MED ORDER — HEPARIN SODIUM (PORCINE) 1000 UNIT/ML IJ SOLN
INTRAMUSCULAR | Status: AC
  Filled 2018-05-13: qty 1

## 2018-05-13 MED ORDER — METOPROLOL TARTRATE 25 MG/10 ML ORAL SUSPENSION: 12.5000 mg | Freq: Two times a day (BID) | ORAL | Status: DC

## 2018-05-13 MED ORDER — MIDAZOLAM HCL 2 MG/2ML IJ SOLN: 2.0000 mg | INTRAMUSCULAR | Status: DC | PRN

## 2018-05-13 MED ORDER — SODIUM CHLORIDE 0.9 % IV SOLN
1.5000 g | Freq: Two times a day (BID) | INTRAVENOUS | Status: AC
  Administered 2018-05-13 – 2018-05-15 (×4): 1.5 g via INTRAVENOUS
  Filled 2018-05-13 (×4): qty 1.5

## 2018-05-13 MED ORDER — METOPROLOL TARTRATE 12.5 MG HALF TABLET
12.5000 mg | ORAL_TABLET | Freq: Once | ORAL | Status: AC
  Administered 2018-05-13: 12.5 mg via ORAL
  Filled 2018-05-13: qty 1

## 2018-05-13 MED ORDER — ONDANSETRON HCL 4 MG/2ML IJ SOLN
INTRAMUSCULAR | Status: AC
  Filled 2018-05-13: qty 2

## 2018-05-13 MED ORDER — SODIUM CHLORIDE 0.9 % IV SOLN
250.0000 mL | INTRAVENOUS | Status: DC
  Administered 2018-05-14: 250 mL via INTRAVENOUS
  Administered 2018-05-14: 20 mL via INTRAVENOUS
  Administered 2018-05-15: 1000 mL via INTRAVENOUS

## 2018-05-13 MED ORDER — LACTATED RINGERS IV SOLN: 500.0000 mL | Freq: Once | INTRAVENOUS | Status: DC | PRN

## 2018-05-13 MED ORDER — PROTAMINE SULFATE 10 MG/ML IV SOLN
INTRAVENOUS | Status: DC | PRN
  Administered 2018-05-13: 200 mg via INTRAVENOUS
  Administered 2018-05-13: 10 mg via INTRAVENOUS

## 2018-05-13 MED ORDER — FENTANYL CITRATE (PF) 250 MCG/5ML IJ SOLN
INTRAMUSCULAR | Status: AC
  Filled 2018-05-13: qty 25

## 2018-05-13 MED ORDER — METOPROLOL TARTRATE 12.5 MG HALF TABLET
12.5000 mg | ORAL_TABLET | Freq: Two times a day (BID) | ORAL | Status: DC
  Administered 2018-05-13 – 2018-05-18 (×8): 12.5 mg via ORAL
  Filled 2018-05-13 (×10): qty 1

## 2018-05-13 MED ORDER — ACETAMINOPHEN 160 MG/5ML PO SOLN: 650.0000 mg | Freq: Once | ORAL | Status: DC

## 2018-05-13 MED ORDER — DOCUSATE SODIUM 100 MG PO CAPS
200.0000 mg | ORAL_CAPSULE | Freq: Every day | ORAL | Status: DC
  Administered 2018-05-14 – 2018-05-19 (×5): 200 mg via ORAL
  Filled 2018-05-13 (×6): qty 2

## 2018-05-13 MED ORDER — SODIUM CHLORIDE 0.9% FLUSH
3.0000 mL | Freq: Two times a day (BID) | INTRAVENOUS | Status: DC
  Administered 2018-05-14 – 2018-05-19 (×8): 3 mL via INTRAVENOUS

## 2018-05-13 MED ORDER — GLUTARALDEHYDE 0.625% SOAKING SOLUTION
TOPICAL | Status: DC
  Filled 2018-05-13: qty 50

## 2018-05-13 MED ORDER — CHLORHEXIDINE GLUCONATE 0.12 % MT SOLN
15.0000 mL | OROMUCOSAL | Status: AC
  Administered 2018-05-13: 15 mL via OROMUCOSAL

## 2018-05-13 MED ORDER — PROTAMINE SULFATE 10 MG/ML IV SOLN
INTRAVENOUS | Status: AC
  Filled 2018-05-13: qty 25

## 2018-05-13 MED ORDER — TRAMADOL HCL 50 MG PO TABS
50.0000 mg | ORAL_TABLET | ORAL | Status: DC | PRN
  Administered 2018-05-14: 50 mg via ORAL
  Administered 2018-05-14 – 2018-05-18 (×4): 100 mg via ORAL
  Filled 2018-05-13 (×3): qty 2
  Filled 2018-05-13: qty 1
  Filled 2018-05-13: qty 2

## 2018-05-13 MED ORDER — BUPIVACAINE HCL (PF) 0.5 % IJ SOLN
INTRAMUSCULAR | Status: DC | PRN
  Administered 2018-05-13: 30 mL

## 2018-05-13 MED ORDER — DEXAMETHASONE SODIUM PHOSPHATE 4 MG/ML IJ SOLN
INTRAMUSCULAR | Status: DC | PRN
  Administered 2018-05-13: 10 mg via INTRAVENOUS

## 2018-05-13 MED ORDER — ROCURONIUM BROMIDE 50 MG/5ML IV SOSY
PREFILLED_SYRINGE | INTRAVENOUS | Status: AC
  Filled 2018-05-13: qty 10

## 2018-05-13 MED ORDER — SODIUM CHLORIDE 0.9 % IV SOLN
INTRAVENOUS | Status: DC
  Filled 2018-05-13: qty 1

## 2018-05-13 MED ORDER — 0.9 % SODIUM CHLORIDE (POUR BTL) OPTIME
TOPICAL | Status: DC | PRN
  Administered 2018-05-13: 5000 mL

## 2018-05-13 MED ORDER — MORPHINE SULFATE (PF) 2 MG/ML IV SOLN
1.0000 mg | INTRAVENOUS | Status: DC | PRN
  Administered 2018-05-13 – 2018-05-14 (×3): 2 mg via INTRAVENOUS
  Administered 2018-05-15: 1 mg via INTRAVENOUS
  Filled 2018-05-13 (×6): qty 1

## 2018-05-13 MED ORDER — METOPROLOL TARTRATE 5 MG/5ML IV SOLN: 2.5000 mg | INTRAVENOUS | Status: DC | PRN

## 2018-05-13 MED ORDER — ONDANSETRON HCL 4 MG/2ML IJ SOLN
INTRAMUSCULAR | Status: DC | PRN
  Administered 2018-05-13: 4 mg via INTRAVENOUS

## 2018-05-13 MED ORDER — MORPHINE SULFATE (PF) 2 MG/ML IV SOLN: 1.0000 mg | INTRAVENOUS | Status: DC | PRN

## 2018-05-13 MED ORDER — BUPIVACAINE HCL (PF) 0.5 % IJ SOLN
INTRAMUSCULAR | Status: AC
  Filled 2018-05-13: qty 30

## 2018-05-13 SURGICAL SUPPLY — 115 items
ADAPTER CARDIO PERF ANTE/RETRO (ADAPTER) ×3 IMPLANT
BAG DECANTER FOR FLEXI CONT (MISCELLANEOUS) ×3 IMPLANT
BLADE SURG 11 STRL SS (BLADE) ×3 IMPLANT
BLADE SURG ROTATE 9660 (MISCELLANEOUS) ×3 IMPLANT
CANISTER SUCT 3000ML PPV (MISCELLANEOUS) ×6 IMPLANT
CANNULA FEM VENOUS REMOTE 22FR (CANNULA) ×3 IMPLANT
CANNULA FEMORAL ART 14 SM (MISCELLANEOUS) ×3 IMPLANT
CANNULA GUNDRY RCSP 15FR (MISCELLANEOUS) ×3 IMPLANT
CANNULA OPTISITE PERFUSION 16F (CANNULA) IMPLANT
CANNULA OPTISITE PERFUSION 18F (CANNULA) ×3 IMPLANT
CANNULA SUMP PERICARDIAL (CANNULA) ×6 IMPLANT
CATH KIT ON Q 5IN SLV (PAIN MANAGEMENT) IMPLANT
CATH KIT ON-Q SILVERSOAK 5IN (CATHETERS) ×3 IMPLANT
CATH ROBINSON RED A/P 18FR (CATHETERS) ×3 IMPLANT
CONN ST 1/4X3/8  BEN (MISCELLANEOUS) ×2
CONN ST 1/4X3/8 BEN (MISCELLANEOUS) ×4 IMPLANT
CONNECTOR 1/2X3/8X1/2 3 WAY (MISCELLANEOUS) ×1
CONNECTOR 1/2X3/8X1/2 3WAY (MISCELLANEOUS) ×2 IMPLANT
CONT SPEC 4OZ CLIKSEAL STRL BL (MISCELLANEOUS) ×3 IMPLANT
COVER BACK TABLE 24X17X13 BIG (DRAPES) ×3 IMPLANT
COVER PROBE W GEL 5X96 (DRAPES) ×3 IMPLANT
CRADLE DONUT ADULT HEAD (MISCELLANEOUS) ×3 IMPLANT
DERMABOND ADVANCED (GAUZE/BANDAGES/DRESSINGS) ×1
DERMABOND ADVANCED .7 DNX12 (GAUZE/BANDAGES/DRESSINGS) ×2 IMPLANT
DEVICE PMI PUNCTURE CLOSURE (MISCELLANEOUS) ×3 IMPLANT
DEVICE TROCAR PUNCTURE CLOSURE (ENDOMECHANICALS) ×3 IMPLANT
DRAIN CHANNEL 28F RND 3/8 FF (WOUND CARE) ×6 IMPLANT
DRAPE BILATERAL SPLIT (DRAPES) ×3 IMPLANT
DRAPE C-ARM 42X72 X-RAY (DRAPES) ×3 IMPLANT
DRAPE CV SPLIT W-CLR ANES SCRN (DRAPES) ×3 IMPLANT
DRAPE HALF SHEET 40X57 (DRAPES) ×3 IMPLANT
DRAPE INCISE IOBAN 66X45 STRL (DRAPES) ×6 IMPLANT
DRAPE SLUSH/WARMER DISC (DRAPES) ×3 IMPLANT
DRSG AQUACEL AG ADV 3.5X10 (GAUZE/BANDAGES/DRESSINGS) ×3 IMPLANT
DRSG COVADERM 4X8 (GAUZE/BANDAGES/DRESSINGS) ×3 IMPLANT
ELECT BLADE 6.5 EXT (BLADE) ×3 IMPLANT
ELECT REM PT RETURN 9FT ADLT (ELECTROSURGICAL) ×6
ELECTRODE REM PT RTRN 9FT ADLT (ELECTROSURGICAL) ×4 IMPLANT
FEMORAL VENOUS CANN RAP (CANNULA) IMPLANT
GAUZE SPONGE 4X4 12PLY STRL (GAUZE/BANDAGES/DRESSINGS) ×3 IMPLANT
GLOVE BIO SURGEON STRL SZ 6.5 (GLOVE) ×9 IMPLANT
GLOVE BIOGEL PI IND STRL 7.0 (GLOVE) ×2 IMPLANT
GLOVE BIOGEL PI INDICATOR 7.0 (GLOVE) ×1
GLOVE ECLIPSE 8.0 STRL XLNG CF (GLOVE) ×3 IMPLANT
GLOVE ORTHO TXT STRL SZ7.5 (GLOVE) ×9 IMPLANT
GOWN STRL REUS W/ TWL LRG LVL3 (GOWN DISPOSABLE) ×12 IMPLANT
GOWN STRL REUS W/TWL LRG LVL3 (GOWN DISPOSABLE) ×6
IV NS IRRIG 3000ML ARTHROMATIC (IV SOLUTION) ×3 IMPLANT
IV SOD CHL 0.9% 1000ML (IV SOLUTION) ×3 IMPLANT
KIT BASIN OR (CUSTOM PROCEDURE TRAY) ×3 IMPLANT
KIT DILATOR VASC 18G NDL (KITS) ×3 IMPLANT
KIT DRAINAGE VACCUM ASSIST (KITS) ×3 IMPLANT
KIT SUCTION CATH 14FR (SUCTIONS) ×3 IMPLANT
KIT TURNOVER KIT B (KITS) ×3 IMPLANT
LEAD PACING MYOCARDI (MISCELLANEOUS) ×3 IMPLANT
LINE VENT (MISCELLANEOUS) ×3 IMPLANT
NEEDLE AORTIC ROOT 14G 7F (CATHETERS) ×3 IMPLANT
NS IRRIG 1000ML POUR BTL (IV SOLUTION) ×15 IMPLANT
PACK OPEN HEART (CUSTOM PROCEDURE TRAY) ×3 IMPLANT
PAD ARMBOARD 7.5X6 YLW CONV (MISCELLANEOUS) ×6 IMPLANT
PAD ELECT DEFIB RADIOL ZOLL (MISCELLANEOUS) ×3 IMPLANT
SET CANNULATION TOURNIQUET (MISCELLANEOUS) ×3 IMPLANT
SET CARDIOPLEGIA MPS 5001102 (MISCELLANEOUS) ×3 IMPLANT
SET IRRIG TUBING LAPAROSCOPIC (IRRIGATION / IRRIGATOR) ×3 IMPLANT
SOLUTION ANTI FOG 6CC (MISCELLANEOUS) ×3 IMPLANT
SUT BONE WAX W31G (SUTURE) ×3 IMPLANT
SUT E-PACK MINIMALLY INVASIVE (SUTURE) ×3 IMPLANT
SUT ETHIBOND (SUTURE) IMPLANT
SUT ETHIBOND 2 0 SH (SUTURE) IMPLANT
SUT ETHIBOND 2 0 V4 (SUTURE) IMPLANT
SUT ETHIBOND 2 0V4 GREEN (SUTURE) IMPLANT
SUT ETHIBOND 2-0 RB-1 WHT (SUTURE) IMPLANT
SUT ETHIBOND 4 0 TF (SUTURE) IMPLANT
SUT ETHIBOND 5 0 C 1 30 (SUTURE) IMPLANT
SUT ETHIBOND NAB MH 2-0 36IN (SUTURE) IMPLANT
SUT ETHIBOND X763 2 0 SH 1 (SUTURE) ×3 IMPLANT
SUT GORETEX 6.0 TH-9 30 IN (SUTURE) IMPLANT
SUT GORETEX CV 4 TH 22 36 (SUTURE) ×3 IMPLANT
SUT GORETEX CV-5THC-13 36IN (SUTURE) IMPLANT
SUT GORETEX CV4 TH-18 (SUTURE) ×6 IMPLANT
SUT GORETEX TH-18 36 INCH (SUTURE) IMPLANT
SUT MNCRL AB 3-0 PS2 18 (SUTURE) IMPLANT
SUT PROLENE 3 0 SH DA (SUTURE) ×6 IMPLANT
SUT PROLENE 3 0 SH1 36 (SUTURE) ×27 IMPLANT
SUT PROLENE 4 0 RB 1 (SUTURE) ×7
SUT PROLENE 4-0 RB1 .5 CRCL 36 (SUTURE) ×14 IMPLANT
SUT PROLENE 5 0 C 1 36 (SUTURE) IMPLANT
SUT PROLENE 6 0 C 1 30 (SUTURE) IMPLANT
SUT SILK  1 MH (SUTURE) ×1
SUT SILK 1 MH (SUTURE) ×2 IMPLANT
SUT SILK 1 TIES 10X30 (SUTURE) IMPLANT
SUT SILK 2 0 SH CR/8 (SUTURE) IMPLANT
SUT SILK 2 0 TIES 10X30 (SUTURE) IMPLANT
SUT SILK 2 0SH CR/8 30 (SUTURE) IMPLANT
SUT SILK 3 0 (SUTURE)
SUT SILK 3 0 SH CR/8 (SUTURE) IMPLANT
SUT SILK 3 0SH CR/8 30 (SUTURE) IMPLANT
SUT SILK 3-0 18XBRD TIE 12 (SUTURE) IMPLANT
SUT TEM PAC WIRE 2 0 SH (SUTURE) IMPLANT
SUT VIC AB 2-0 CTX 36 (SUTURE) IMPLANT
SUT VIC AB 2-0 UR6 27 (SUTURE) IMPLANT
SUT VIC AB 3-0 SH 8-18 (SUTURE) ×3 IMPLANT
SUT VICRYL 2 TP 1 (SUTURE) IMPLANT
SYR 10ML LL (SYRINGE) ×3 IMPLANT
SYSTEM SAHARA CHEST DRAIN ATS (WOUND CARE) ×3 IMPLANT
TAPE CLOTH SURG 4X10 WHT LF (GAUZE/BANDAGES/DRESSINGS) ×3 IMPLANT
TAPE PAPER 2X10 WHT MICROPORE (GAUZE/BANDAGES/DRESSINGS) ×3 IMPLANT
TOWEL GREEN STERILE (TOWEL DISPOSABLE) ×3 IMPLANT
TOWEL GREEN STERILE FF (TOWEL DISPOSABLE) ×3 IMPLANT
TROCAR XCEL BLADELESS 5X75MML (TROCAR) ×3 IMPLANT
TROCAR XCEL NON-BLD 11X100MML (ENDOMECHANICALS) ×6 IMPLANT
TUBE SUCT INTRACARD DLP 20F (MISCELLANEOUS) ×3 IMPLANT
TUNNELER SHEATH ON-Q 11GX8 DSP (PAIN MANAGEMENT) ×3 IMPLANT
UNDERPAD 30X30 (UNDERPADS AND DIAPERS) ×3 IMPLANT
WATER STERILE IRR 1000ML POUR (IV SOLUTION) ×6 IMPLANT

## 2018-05-13 NOTE — Op Note (Addendum)
CARDIOTHORACIC SURGERY OPERATIVE NOTE  Date of Procedure:  05/13/2018  Preoperative Diagnosis: Left Atrial Myxoma  Postoperative Diagnosis: Same  Procedure:    Minimally-Invasive Resection of Left Atrial Myxoma  Autologous pericardial patch closure of interatrial septum   Surgeon: Valentina Gu. Roxy Manns, MD  Assistant: Ellwood Handler, PA-C   Anesthesia: Roderic Palau, MD  Operative Findings:  2.4 cm mass adherent to left atrial surface of fossa ovalis c/w benign atrial myxoma   Normal left ventricular systolic function         BRIEF CLINICAL NOTE AND INDICATIONS FOR SURGERY  Patient is a 62 year old female who has been referred for evaluation of left atrial mass suspicious for possible atrial myxoma.  Patient is originally from [place] but has lived in [place] for more than 15 years. Approximately 2 years ago she began to experience symptoms of chest discomfort that are somewhat atypical but exacerbated by physical exertion. Symptoms are described as mild dull pain across her chest that occasionally radiates through to the back. Symptoms are "always present" but seem to be relieved by rest. Symptoms have been associated with decreased energy and exertional shortness of breath. The patient was reportedly evaluated while she was traveling in [place] and told that she had a likely atrial myxoma. Surgical intervention was recommended but the patient desired to return home for medical therapy. She was recently seen by her primary care physician and an echocardiogram performed February 26, 2018. By report the echocardiogram revealed normal left ventricular size and systolic function with an echogenic mass in the left atrium measuring 1.9 x 1.4 cm suspicious for atrial myxoma. The patient subsequently underwent cardiac gated MRI on March 14, 2018. This confirmed the presence of a heterogeneous globular nonenhancing mass in the left atrium attached to the intra-atrial septum measuring 2.4  x 2.0 cm. There was normal left ventricular size and systolic function. On delayed gadolinium imaging there was no late enhancement. No other abnormalities were noted and TEE was recommended. The patient was referred for surgical consultation.  The patient has been seen in consultation and counseled at length regarding the indications, risks and potential benefits of surgery.  All questions have been answered, and the patient provides full informed consent for the operation as described.    DETAILS OF THE OPERATIVE PROCEDURE  Preparation:  The patient is brought to the operating room on the above mentioned date and central monitoring was established by the anesthesia team including placement of Swan-Ganz catheter through the left internal jugular vein.  A radial arterial line is placed. The patient is placed in the supine position on the operating table.  Intravenous antibiotics are administered. General endotracheal anesthesia is induced uneventfully. The patient is initially intubated using a dual lumen endotracheal tube.  A Foley catheter is placed.  Baseline transesophageal echocardiogram was performed.  Findings were notable for normal left ventricular size and systolic function.  There was an obvious mass adherent to the left atrial surface of the fossa ovalis that measured 2.4 cm in its greatest diameter.  Gross anatomical appearance was pathognomonic for atrial myxoma.  There was no significant mitral regurgitation.  No other abnormalities were noted.  A soft roll is placed behind the patient's left scapula and the neck gently extended and turned to the left.   The patient's right neck, chest, abdomen, both groins, and both lower extremities are prepared and draped in a sterile manner. A time out procedure is performed.  Surgical Approach:  A right miniature anterolateral thoracotomy incision is performed.  The incision is placed just lateral to and superior to the right nipple. The  pectoralis major muscle is retracted medially and completely preserved. The right pleural space is entered through the 3rd intercostal space. A soft tissue retractor is placed.  Two 11 mm ports are placed through separate stab incisions inferiorly. The right pleural space is insufflated continuously with carbon dioxide gas through the posterior port during the remainder of the operation.  A pledgeted sutures placed through the dome of the right hemidiaphragm and retracted inferiorly to facilitate exposure.  A longitudinal incision is made in the pericardium 3 cm anterior to the phrenic nerve and silk traction sutures are placed on either side of the incision for exposure.   Extracorporeal Cardiopulmonary Bypass and Myocardial Protection:  A small incision is made in the right inguinal crease and the anterior surface of the right common femoral artery and right common femoral vein are identified.  The patient is placed in Trendelenburg position. The right internal jugular vein is cannulated with Seldinger technique and a guidewire advanced into the right atrium. The patient is heparinized systemically. The right internal jugular vein is cannulated with a 14 [place] pediatric femoral venous cannula. Pursestring sutures are placed on the anterior surface of the right common femoral vein and right common femoral artery. The right common femoral vein is cannulated with the Seldinger technique and a guidewire is advanced under transesophageal echocardiogram guidance through the right atrium. The femoral vein is cannulated with a long 22 French femoral venous cannula. The right common femoral artery is cannulated with Seldinger technique and a flexible guidewire is advanced until it can be appreciated intraluminally in the descending thoracic aorta on transesophageal echocardiogram. The femoral artery is cannulated with an 18 French femoral arterial cannula.  Adequate heparinization is verified.     The entire  pre-bypass portion of the operation was notable for stable hemodynamics.  Cardiopulmonary bypass was begun.  Vacuum assist venous drainage is utilized. The incision in the pericardium is extended in both directions.  A portion of the patient's pericardium is excised and subsequently soaked in 0.625% glutaraldehyde solution for a total of 3 minutes.  The patch is subsequently rinsed and consecutive baths of saline.  Venous drainage and exposure are notably excellent.  An antegrade cardioplegia cannula is placed in the ascending aorta.  Umbilical tapes were placed around the superior and inferior vena cavae.  The patient is allowed to cool passively to [facility] systemic temperature.  The aortic cross clamp is applied and cardioplegia is delivered initially in an antegrade fashion through the aortic root using modified [place] cold blood cardioplegia ([place] blood cardioplegia protocol).   The initial cardioplegic arrest is rapid with early diastolic arrest.  Myocardial protection was felt to be excellent.   Resection of Left Atrial Myxoma:  A small left atriotomy incision was performed through the intra-atrial groove.  Through the incision the left atrial surface of the fossa ovalis was examined.  There is a large mass consistent with atrial myxoma.  A sump drain is placed through the left atrial incision to serve the left ventricular vent.  An oblique right atriotomy incision is performed.  The femoral venous cannula is pulled down until the tip is just outside of the right atrium.  Both vena cavae snares are tightened.  Traction sutures are placed in the right atrial wall to facilitate exposure of the fossa ovalis.  A right angle clamp is placed through the left atriotomy incision and positioned on the fossa  ovalis anterior and lateral to the stalk of the myxoma.  A small incision is made through the fossa ovalis well away from the stalk of the myxoma.  The entire fossa ovalis is subsequently excised  sharply under direct vision and the entire atrial myxoma and fossa ovalis are removed.  The mass was sent to pathology for routine histology.  The autologous pericardium patch is trimmed to an elliptical shaped and utilized to patch the closure of the fossa ovalis.  This is performed using a 2 layer closure of running 4-0 Prolene suture.  Rewarming is begun.  The left atriotomy incision is closed using a 2 layer closure of running 3-0 Prolene suture while leaving the sump drain across the mitral valve to serve as a left ventricular vent.  One final dose of warm retrograde "reanimation dose" cardioplegia was administered antegrade through the aortic root.  The aortic cross clamp was removed after a total cross clamp time of 58 minutes.  The right atriotomy incision is closed using a 2 layer closure of running 4-0 Prolene suture.   Procedure Completion:  Epicardial pacing wires are fixed to the inferior wall of the right ventricule and to the right atrial appendage. The patient is rewarmed to 37C temperature. The left ventricular vent is removed. The antegrade cardioplegia cannula is removed. The patient is weaned and disconnected from cardiopulmonary bypass.  The patient's rhythm at separation from bypass was AV paced.  The patient was weaned from bypass without any inotropic support. Total cardiopulmonary bypass time for the operation was 96 minutes.  Followup transesophageal echocardiogram performed after separation from bypass revealed no sign of intra-atrial communication.  Left ventricular function was unchanged from preoperatively.  There was notably some turbulent flow through the left inferior pulmonary vein at the site of its insertion into the left atrium, presumably due to minor kinking of the pulmonary vein caused by the left atriotomy closure.  The femoral arterial and venous cannulae were removed uneventfully. There was a palpable pulse in the distal right common femoral artery after removal  of the cannula. Protamine was administered to reverse the anticoagulation. The right internal jugular cannula was removed and manual pressure held on the neck for 15 minutes.  Single lung ventilation was begun. The atriotomy closure was inspected for hemostasis. The pericardial sac was drained using a 28 French Bard drain placed through the anterior port incision.  The right pleural space is irrigated with saline solution and inspected for hemostasis.   The On-Q pain management system is utilized for postoperative analgesia.  A single lumen catheter is passed through the subcutaneous tissues from the anterior chest wall to the posterior port incision.  The catheter was then passed through the port incision into the pleural space and tunneled into the subpleural space posteriorly to cover the second through the sixth intercostal nerve roots.  The catheter was flushed with 0.5% bupivacaine solution and ultimately connected to a continuous infusion pump.  The right pleural space was drained using a 28 French Bard drain placed through the posterior port incision. The miniature thoracotomy incision was closed in multiple layers in routine fashion. The right groin incision was inspected for hemostasis and closed in multiple layers in routine fashion.  The post-bypass portion of the operation was notable for stable rhythm and hemodynamics.  No blood products were administered during the operation.   Disposition:  The patient tolerated the procedure well.  The patient was extubated in the operating room and subsequently transported to the surgical  intensive care unit in stable condition. There were no intraoperative complications. All sponge instrument and needle counts are verified correct at completion of the operation.     Valentina Gu. Roxy Manns MD 05/13/2018 12:14 PM

## 2018-05-13 NOTE — Anesthesia Postprocedure Evaluation (Signed)
Anesthesia Post Note  Patient: Hannah Bond  Procedure(s) Performed: MINIMALLY INVASIVE EXCISION OF  LEFT ATRIAL MYXOMA (Left Chest) TRANSESOPHAGEAL ECHOCARDIOGRAM (TEE) (N/A )     Patient location during evaluation: SICU Anesthesia Type: General Level of consciousness: awake Pain management: pain level controlled Vital Signs Assessment: post-procedure vital signs reviewed and stable Respiratory status: patient connected to nasal cannula oxygen Cardiovascular status: stable Postop Assessment: no apparent nausea or vomiting Anesthetic complications: no    Last Vitals:  Vitals:   05/13/18 1600 05/13/18 1630  BP: 102/69 100/65  Pulse: 77 76  Resp: 20 (!) 23  Temp: (!) 36.2 C (!) 36.3 C  SpO2: 100% 100%    Last Pain:  Vitals:   05/13/18 1300  TempSrc: Core  PainSc: Asleep                 Lazarus Sudbury,W. EDMOND

## 2018-05-13 NOTE — Progress Notes (Signed)
[facility] BRIEF SICU PROGRESS NOTE  Day of Surgery  S/P Procedure(s) (LRB): MINIMALLY INVASIVE EXCISION OF  LEFT ATRIAL MYXOMA (Left) TRANSESOPHAGEAL ECHOCARDIOGRAM (TEE) (N/A)   Looks good.  Mild soreness in chest.   Breathing comfortably w/ O2 sats 100% on 1 L/min via [place] NSR w/ stable hemodynamics, no drips Minimal chest tube output UOP adequate Labs okay CXR clear  Plan: Continue routine early postop.  D/C Swan-Ganz.  OOB to chair  Rexene Alberts, MD 05/13/2018 5:48 PM

## 2018-05-13 NOTE — Anesthesia Procedure Notes (Signed)
Central Venous Catheter Insertion Performed by: Roderic Palau, MD, anesthesiologist Start/End9/24/2019 6:50 AM, 05/13/2018 7:05 AM Patient location: Pre-op. Preanesthetic checklist: patient identified, IV checked, site marked, risks and benefits discussed, surgical consent, monitors and equipment checked, pre-op evaluation, timeout performed and anesthesia consent Hand hygiene performed  and maximum sterile barriers used  PA cath was placed.Swan type:thermodilution PA Cath depth:48 Procedure performed without using ultrasound guided technique. Attempts: 1 Patient tolerated the procedure well with no immediate complications.

## 2018-05-13 NOTE — Anesthesia Procedure Notes (Signed)
Central Venous Catheter Insertion Performed by: Roderic Palau, MD, anesthesiologist Start/End9/24/2019 6:50 AM, 05/13/2018 7:05 AM Patient location: Pre-op. Preanesthetic checklist: patient identified, IV checked, site marked, risks and benefits discussed, surgical consent, monitors and equipment checked, pre-op evaluation, timeout performed and anesthesia consent Position: Trendelenburg Lidocaine 1% used for infiltration and patient sedated Hand hygiene performed , maximum sterile barriers used  and Seldinger technique used Catheter size: 8.5 Fr Total catheter length 10. Central line was placed.Sheath introducer Procedure performed using ultrasound guided technique. Ultrasound Notes:anatomy identified, needle tip was noted to be adjacent to the nerve/plexus identified, no ultrasound evidence of intravascular and/or intraneural injection and image(s) printed for medical record Attempts: 1 Following insertion, line sutured, dressing applied and Biopatch. Post procedure assessment: blood return through all ports, free fluid flow and no air  Patient tolerated the procedure well with no immediate complications.

## 2018-05-13 NOTE — Interval H&P Note (Signed)
History and Physical Interval Note:  05/13/2018 5:59 AM  Hannah Bond  has presented today for surgery, with the diagnosis of Left atrial myxoma  The various methods of treatment have been discussed with the patient and family. After consideration of risks, benefits and other options for treatment, the patient has consented to  Procedure(s): MINIMALLY INVASIVE EXCISION OF  LEFT ATRIAL MYXOMA (Left) TRANSESOPHAGEAL ECHOCARDIOGRAM (TEE) (N/A) as a surgical intervention .  The patient's history has been reviewed, patient examined, no change in status, stable for surgery.  I have reviewed the patient's chart and labs.  Questions were answered to the patient's satisfaction.     Rexene Alberts

## 2018-05-13 NOTE — Brief Op Note (Signed)
05/13/2018  12:11 PM  PATIENT:  Hannah Bond  62 y.o. female  PRE-OPERATIVE DIAGNOSIS:  Left atrial myxoma  POST-OPERATIVE DIAGNOSIS:  Left atrial myxoma  PROCEDURE:  Procedure(s):  MINIMALLY INVASIVE EXCISION OF  LEFT ATRIAL MYXOMA (Left) TRANSESOPHAGEAL ECHOCARDIOGRAM (TEE) (N/A)  SURGEON:  Surgeon(s) and Role:    Rexene Alberts, MD - Primary  PHYSICIAN ASSISTANT: Ellwood Handler PA-C  ANESTHESIA:   general  EBL:  400  BLOOD ADMINISTERED: CELLSAVER  DRAINS: Right Pleural Chest tube, On-Q Pain Catheter   LOCAL MEDICATIONS USED:  BUPIVICAINE   SPECIMEN:  Source of Specimen:  Left Atrial Myxoma  DISPOSITION OF SPECIMEN:  PATHOLOGY  COUNTS:  YES  TOURNIQUET:  * No tourniquets in log *  DICTATION: .Dragon Dictation  PLAN OF CARE: Admit to inpatient   PATIENT DISPOSITION:  ICU - intubated and hemodynamically stable.   Delay start of Pharmacological VTE agent (>24hrs) due to surgical blood loss or risk of bleeding: yes

## 2018-05-13 NOTE — Anesthesia Procedure Notes (Signed)
Arterial Line Insertion Start/End9/24/2019 7:00 AM, 05/13/2018 7:15 AM Performed by: Josephine Igo, CRNA  Patient location: Pre-op. Preanesthetic checklist: patient identified, IV checked, site marked, risks and benefits discussed, surgical consent, monitors and equipment checked, pre-op evaluation, timeout performed and anesthesia consent Lidocaine 1% used for infiltration Left, radial was placed Catheter size: 20 Fr Hand hygiene performed  and maximum sterile barriers used   Attempts: 1 Procedure performed without using ultrasound guided technique. Following insertion, dressing applied. Post procedure assessment: normal and unchanged

## 2018-05-13 NOTE — Transfer of Care (Signed)
Immediate Anesthesia Transfer of Care Note  Patient: Hannah Bond  Procedure(s) Performed: MINIMALLY INVASIVE EXCISION OF  LEFT ATRIAL MYXOMA (Left Chest) TRANSESOPHAGEAL ECHOCARDIOGRAM (TEE) (N/A )  Patient Location: SICU  Anesthesia Type:General  Level of Consciousness: drowsy and patient cooperative  Airway & Oxygen Therapy: Patient Spontanous Breathing and Patient connected to face mask oxygen  Post-op Assessment: Report given to RN and Post -op Vital signs reviewed and stable  Post vital signs: Reviewed and stable  Last Vitals:  Vitals Value Taken Time  BP    Temp    Pulse    Resp    SpO2      Last Pain:  Vitals:   05/13/18 0630  PainSc: 0-No pain         Complications: No apparent anesthesia complications

## 2018-05-13 NOTE — OR Nursing (Signed)
11:30 - 45 minute call to SICU nurse 12:10 - 20 minute call to SICU nurse

## 2018-05-13 NOTE — Anesthesia Procedure Notes (Signed)
Procedure Name: Intubation Date/Time: 05/13/2018 7:50 AM Performed by: Clearnce Sorrel, CRNA Pre-anesthesia Checklist: Patient identified, Emergency Drugs available, Suction available, Patient being monitored and Timeout performed Patient Re-evaluated:Patient Re-evaluated prior to induction Oxygen Delivery Method: Circle system utilized Preoxygenation: Pre-oxygenation with 100% oxygen Induction Type: IV induction Ventilation: Mask ventilation without difficulty and Oral airway inserted - appropriate to patient size Laryngoscope Size: Mac and 3 Grade View: Grade I Endobronchial tube: Left, Double lumen EBT and EBT position confirmed by fiberoptic bronchoscope and 37 Fr Number of attempts: 1 Airway Equipment and Method: Stylet Placement Confirmation: ETT inserted through vocal cords under direct vision,  positive ETCO2 and breath sounds checked- equal and bilateral Tube secured with: Tape Dental Injury: Teeth and Oropharynx as per pre-operative assessment

## 2018-05-14 ENCOUNTER — Inpatient Hospital Stay (HOSPITAL_COMMUNITY): Payer: BLUE CROSS/BLUE SHIELD

## 2018-05-14 ENCOUNTER — Encounter (HOSPITAL_COMMUNITY): Payer: Self-pay | Admitting: Thoracic Surgery (Cardiothoracic Vascular Surgery)

## 2018-05-14 LAB — GLUCOSE, CAPILLARY
GLUCOSE-CAPILLARY: 123 mg/dL — AB (ref 70–99)
GLUCOSE-CAPILLARY: 96 mg/dL (ref 70–99)
GLUCOSE-CAPILLARY: 98 mg/dL (ref 70–99)
GLUCOSE-CAPILLARY: 98 mg/dL (ref 70–99)
GLUCOSE-CAPILLARY: 99 mg/dL (ref 70–99)
Glucose-Capillary: 109 mg/dL — ABNORMAL HIGH (ref 70–99)
Glucose-Capillary: 112 mg/dL — ABNORMAL HIGH (ref 70–99)
Glucose-Capillary: 98 mg/dL (ref 70–99)

## 2018-05-14 LAB — BASIC METABOLIC PANEL
Anion gap: 8 (ref 5–15)
Anion gap: 11 (ref 5–15)
BUN: 9 mg/dL (ref 8–23)
BUN: 18 mg/dL (ref 8–23)
CALCIUM: 8.5 mg/dL — AB (ref 8.9–10.3)
CHLORIDE: 104 mmol/L (ref 98–111)
CO2: 21 mmol/L — ABNORMAL LOW (ref 22–32)
CO2: 22 mmol/L (ref 22–32)
CREATININE: 0.52 mg/dL (ref 0.44–1.00)
CREATININE: 0.78 mg/dL (ref 0.44–1.00)
Calcium: 8 mg/dL — ABNORMAL LOW (ref 8.9–10.3)
Chloride: 107 mmol/L (ref 98–111)
GFR calc Af Amer: >60 mL/min (ref >60)
GFR calc non Af Amer: >60 mL/min (ref >60)
GLUCOSE: 118 mg/dL — AB (ref 70–99)
GLUCOSE: 125 mg/dL — AB (ref 70–99)
Potassium: 4.4 mmol/L (ref 3.5–5.1)
Potassium: 4.3 mmol/L (ref 3.5–5.1)
SODIUM: 136 mmol/L (ref 135–145)
Sodium: 137 mmol/L (ref 135–145)

## 2018-05-14 LAB — CBC
HCT: 28.3 % — ABNORMAL LOW (ref 36.0–46.0)
HEMATOCRIT: 31.3 % — AB (ref 36.0–46.0)
HEMOGLOBIN: 10.4 g/dL — AB (ref 12.0–15.0)
Hemoglobin: 9.4 g/dL — ABNORMAL LOW (ref 12.0–15.0)
MCH: 29.5 pg (ref 26.0–34.0)
MCH: 29.6 pg (ref 26.0–34.0)
MCHC: 33.2 g/dL (ref 30.0–36.0)
MCHC: 33.2 g/dL (ref 30.0–36.0)
MCV: 88.7 fL (ref 78.0–100.0)
MCV: 89.2 fL (ref 78.0–100.0)
PLATELETS: 147 10*3/uL — AB (ref 150–400)
Platelets: 153 10*3/uL (ref 150–400)
RBC: 3.19 MIL/uL — ABNORMAL LOW (ref 3.87–5.11)
RBC: 3.51 MIL/uL — ABNORMAL LOW (ref 3.87–5.11)
RDW: 12.4 % (ref 11.5–15.5)
RDW: 13 % (ref 11.5–15.5)
WBC: 13.9 10*3/uL — ABNORMAL HIGH (ref 4.0–10.5)
WBC: 14.8 10*3/uL — AB (ref 4.0–10.5)

## 2018-05-14 LAB — MAGNESIUM
MAGNESIUM: 2.7 mg/dL — AB (ref 1.7–2.4)
MAGNESIUM: 2.4 mg/dL (ref 1.7–2.4)

## 2018-05-14 MED ORDER — FUROSEMIDE 40 MG PO TABS
40.0000 mg | ORAL_TABLET | Freq: Every day | ORAL | Status: AC
  Administered 2018-05-15 – 2018-05-17 (×3): 40 mg via ORAL
  Filled 2018-05-14 (×3): qty 1

## 2018-05-14 MED ORDER — MOVING RIGHT ALONG BOOK
Freq: Once | Status: AC
  Administered 2018-05-14: 1
  Filled 2018-05-14: qty 1

## 2018-05-14 MED ORDER — ENOXAPARIN SODIUM 40 MG/0.4ML ~~LOC~~ SOLN
40.0000 mg | Freq: Every day | SUBCUTANEOUS | Status: DC
  Administered 2018-05-15 – 2018-05-18 (×4): 40 mg via SUBCUTANEOUS
  Filled 2018-05-14 (×4): qty 0.4

## 2018-05-14 MED ORDER — INSULIN ASPART 100 UNIT/ML ~~LOC~~ SOLN
0.0000 [IU] | SUBCUTANEOUS | Status: DC
  Administered 2018-05-14: 2 [IU] via SUBCUTANEOUS

## 2018-05-14 MED ORDER — FUROSEMIDE 10 MG/ML IJ SOLN
20.0000 mg | Freq: Two times a day (BID) | INTRAMUSCULAR | Status: AC
  Administered 2018-05-14 (×2): 20 mg via INTRAVENOUS
  Filled 2018-05-14 (×2): qty 2

## 2018-05-14 MED ORDER — POTASSIUM CHLORIDE CRYS ER 20 MEQ PO TBCR
20.0000 meq | EXTENDED_RELEASE_TABLET | Freq: Every day | ORAL | Status: DC
  Administered 2018-05-15 – 2018-05-19 (×5): 20 meq via ORAL
  Filled 2018-05-14 (×5): qty 1

## 2018-05-14 MED ORDER — INFLUENZA VAC SPLIT QUAD 0.5 ML IM SUSY: 0.5000 mL | PREFILLED_SYRINGE | INTRAMUSCULAR | Status: DC | PRN

## 2018-05-14 MED ORDER — KETOROLAC TROMETHAMINE 15 MG/ML IJ SOLN
15.0000 mg | Freq: Four times a day (QID) | INTRAMUSCULAR | Status: AC
  Administered 2018-05-14 – 2018-05-15 (×5): 15 mg via INTRAVENOUS
  Filled 2018-05-14 (×4): qty 1

## 2018-05-14 MED FILL — Heparin Sodium (Porcine) Inj 1000 Unit/ML: INTRAMUSCULAR | Qty: 20 | Status: AC

## 2018-05-14 MED FILL — Mannitol IV Soln 20%: INTRAVENOUS | Qty: 500 | Status: AC

## 2018-05-14 MED FILL — Electrolyte-R (PH 7.4) Solution: INTRAVENOUS | Qty: 3000 | Status: AC

## 2018-05-14 MED FILL — Magnesium Sulfate Inj 50%: INTRAMUSCULAR | Qty: 10 | Status: AC

## 2018-05-14 MED FILL — Sodium Chloride IV Soln 0.9%: INTRAVENOUS | Qty: 2000 | Status: AC

## 2018-05-14 MED FILL — Potassium Chloride Inj 2 mEq/ML: INTRAVENOUS | Qty: 40 | Status: AC

## 2018-05-14 MED FILL — Lidocaine HCl(Cardiac) IV PF Soln Pref Syr 100 MG/5ML (2%): INTRAVENOUS | Qty: 5 | Status: AC

## 2018-05-14 MED FILL — Heparin Sodium (Porcine) Inj 1000 Unit/ML: INTRAMUSCULAR | Qty: 2500 | Status: AC

## 2018-05-14 MED FILL — Heparin Sodium (Porcine) Inj 1000 Unit/ML: INTRAMUSCULAR | Qty: 30 | Status: AC

## 2018-05-14 NOTE — Discharge Summary (Signed)
Physician Discharge Summary  Patient ID: Hannah Bond MRN: [id number] DOB/AGE: 02-13-1956 62 y.o.  Admit date: 05/13/2018 Discharge date: 05/19/2018  Admission Diagnoses:  Patient Active Problem List   Diagnosis Date Noted  . Myxoma of heart   . Atypical chest pain   . Hyperlipidemia   . Vitamin D deficiency    Discharge Diagnoses:   Patient Active Problem List   Diagnosis Date Noted  . 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 Myxoma of heart   . Atypical chest pain   . Hyperlipidemia   . Vitamin D deficiency    Discharged Condition: good  History of Present Illness:  Hannah Bond is a 62 year old female originally from [place] but has lived in [place] for more than 15 years. Approximately 2 years ago she began to experience symptoms of chest discomfort that are somewhat atypical but exacerbated by physical exertion. Symptoms are described as mild dull pain across her chest that occasionally radiates through to the back. Symptoms are "always present" but seem to be relieved by rest. Symptoms have been associated with decreased energy and exertional shortness of breath. The patient was reportedly evaluated while she was traveling in [place] and told that she had a likely atrial myxoma. Surgical intervention was recommended but the patient desired to return home for medical therapy. She was recently seen by her primary care physician and an echocardiogram performed February 26, 2018. By report the echocardiogram revealed normal left ventricular size and systolic function with an echogenic mass in the left atrium measuring 1.9 x 1.4 cm suspicious for atrial myxoma. The patient subsequently underwent cardiac gated MRI on March 14, 2018. This confirmed the presence of a heterogeneous globular nonenhancing mass in the left atrium attached to the intra-atrial septum measuring 2.4 x 2.0 cm. There was normal left ventricular size and systolic function. On delayed  gadolinium imaging there was no late enhancement. No other abnormalities were noted.  She was referred to Dr. Roxy Manns for evaluation of resection of her left atrial Myxoma.   During evaluation she describes a gradual decline in energy with decreased exercise tolerance. Appetite is decreased but the patient has not lost weight. She describes dull pain across her chest which now seems to radiate through to her back. The pain is exacerbated by physical exertion and partially relieved by rest although the patient states the pain never completely goes away. She describes mild exertional shortness of breath. Shortness of breath and discomfort are made worse by laying flat in bed. She has not had any palpitations, dizzy spells, nor syncope. She denies any history of lower extremity edema or chronic cough.  It was recommended the patient undergo a Minimally Invasive approach for resection of the Left Atrial Myxoma.  The risks and benefits of the procedure were explained to the patient and her daughters and she was agreeable to proceed.  Hospital Course:   Hannah Bond presented to [facility] on 05/13/2018.  She was taken to the operating room and underwent Minimally Invasive Resection of Left Atrial Myxoma.  She tolerated the procedure without difficulty and was taken to the SICU in stable condition.  She was extubated the evening of surgery.  During her stay in the SICU the patients chest tubes and arterial lines were removed without difficulty.  She was started on low dose diuretics.  She had a lot of pain which was attributed to chest tubes in place.  She was maintaining NSR and was transferred to the  telemetry unit on 05/15/2018.  She continued to progress.  Her chest tubes were removed on 05/16/2018.  She continued to maintain NSR and her pacing wires were removed without difficulty.  Her pain remained controlled with oral pain medications.  Her incisions are healing without evidence of infection.  She is  ambulating independently.  She is medically stable for discharge home today.   Significant Diagnostic Studies:   - Left ventricle: The cavity size was normal. Wall thickness was   normal. Systolic function was normal. - Left atrium: The atrium was normal in size. - Right atrium: No evidence of thrombus in the atrial cavity or   appendage. - Atrial septum: There was a 2.1 cm (L) x 1.9 cm (W), pedunculated,   minimally mobile mass in the fossa ovalis; the appearance is   consistent with myxoma.  Heart, biopsy, Atrial - MYXOMA.  Treatments: surgery:    Minimally-Invasive Resection of Left Atrial Myxoma             Autologous pericardial patch closure of interatrial septum  Discharge Exam: Blood pressure 92/62, pulse (!) 109, temperature (!) 97.5 F (36.4 C), temperature source Oral, resp. rate (!) 23, height 5\' 4"  (1.626 m), weight 56.2 kg, SpO2 98 %.  General appearance: alert, cooperative and no distress Heart: regular rate and rhythm Lungs: clear to auscultation bilaterally Abdomen: soft, non-tender; bowel sounds normal; no masses,  no organomegaly Extremities: edema trace Wound: clean and dry  Disposition: Home  Discharge Medications:  The patient has been discharged on:   1.Beta Blocker:  Yes [ x  ]                              No   [   ]                              If No, reason:  2.Ace Inhibitor/ARB: Yes [   ]                                     No  [  x  ]                                     If No, reason: labile BP  3.Statin:   Yes [   ]                  No  [ x  ]                  If No, reason: No CAD  4.S[place]:  Yes  [  x ]                  No   [   ]                  If No, reason:     Allergies as of 05/19/2018   No Known Allergies     Medication List    TAKE these medications   aspirin 325 MG EC tablet Take 1 tablet (325 mg total) by mouth daily.   atorvastatin 40 MG tablet Commonly known as:  LIPITOR Take 40 mg by mouth at bedtime.  metoprolol tartrate 25 MG tablet Commonly known as:  LOPRESSOR Take 1 tablet (25 mg total) by mouth 2 (two) times daily.   multivitamin with minerals Tabs tablet Take 1 tablet by mouth daily.   Oxycodone HCl 10 MG Tabs Take 1 tablet (10 mg total) by mouth every 4 (four) hours as needed for severe pain.      Follow-up Information    Rexene Alberts, MD Follow up on 06/02/2018.   Specialty:  Cardiothoracic Surgery Why:  Appointment is at 11:30, please get CXR at 11:00 at [place] located on first floor of our office building Contact information: [address] [place] [address] [phone]           Signed: Ellwood Handler 05/19/2018, 9:17 AM

## 2018-05-14 NOTE — Progress Notes (Signed)
      [address]       ,[place] [address]             [phone]        CARDIOTHORACIC SURGERY PROGRESS NOTE   R1 Day Post-Op Procedure(s) (LRB): MINIMALLY INVASIVE EXCISION OF  LEFT ATRIAL MYXOMA (Left) TRANSESOPHAGEAL ECHOCARDIOGRAM (TEE) (N/A)  Subjective: Mild soreness in chest.  Otherwise no complaints  Objective: Vital signs: BP Readings from Last 1 Encounters:  05/14/18 122/65   Pulse Readings from Last 1 Encounters:  05/14/18 82   Resp Readings from Last 1 Encounters:  05/14/18 20   Temp Readings from Last 1 Encounters:  05/14/18 98.3 F (36.8 C) (Oral)    Hemodynamics: PAP: (20-32)/(5-13) 30/9 CO:  [3.1 L/min-3.9 L/min] 3.9 L/min CI:  [1.9 L/min/m2-2.4 L/min/m2] 2.4 L/min/m2  Physical Exam:  Rhythm:   sinus  Breath sounds: clear  Heart sounds:  RRR  Incisions:  Dressings dry, intact  Abdomen:  Soft, non-distended, non-tender  Extremities:  Warm, well-perfused  Chest tubes:  low volume thin serosanguinous output, no air leak    Intake/Output from previous day: 09/24 0701 - 09/25 0700 In: 4420 [I.V.:2760.2; Blood:220; IV Piggyback:1439.9] Out: 8527 [Urine:3165; Blood:400; Chest Tube:502] Intake/Output this shift: No intake/output data recorded.  Lab Results:  CBC: Recent Labs    05/13/18 1257 05/13/18 1303 05/14/18 0340  WBC 13.9*  --  13.9*  HGB 9.6* 8.2* 9.4*  HCT 28.9* 24.0* 28.3*  PLT 150  --  147*    BMET:  Recent Labs    05/13/18 1147 05/13/18 1303 05/14/18 0340  NA 140 143 136  K 3.7 3.7 4.4  CL 107  --  107  CO2  --   --  21*  GLUCOSE 115* 87 118*  BUN 5*  --  9  CREATININE 0.30*  --  0.52  CALCIUM  --   --  8.0*     PT/INR:   Recent Labs    05/13/18 1257  LABPROT 16.9*  INR 1.38    CBG (last 3)  Recent Labs    05/14/18 0001 05/14/18 0104 05/14/18 0344  GLUCAP 98 109* 112*    ABG    Component Value Date/Time   PHART 7.354 05/13/2018 1417   PCO2ART 42.0 05/13/2018 1417   PO2ART  71.0 (L) 05/13/2018 1417   HCO3 23.7 05/13/2018 1417   TCO2 25 05/13/2018 1417   ACIDBASEDEF 2.0 05/13/2018 1417   O2SAT 94.0 05/13/2018 1417    CXR: Clear w/ very mild bibasilar atelectasis  Assessment/Plan: S/P Procedure(s) (LRB): MINIMALLY INVASIVE EXCISION OF  LEFT ATRIAL MYXOMA (Left) TRANSESOPHAGEAL ECHOCARDIOGRAM (TEE) (N/A)  Doing well POD1 Maintaining NSR w/ stable BP, no drips Breathing comfortably w/ O2 sats 95-100% on 1 L/min via [place], CXR looks good Expected post op acute blood loss anemia, Hgb 94. stable Expected post op atelectasis, very mild Expected post op volume excess, weight reportedly 2.7 kg > preop, UOP adequate   Mobilize  D/C tubes and lines  Gentle diuresis  Low dose beta blocker and ASA  Transfer 4E   Rexene Alberts, MD 05/14/2018 8:07 AM

## 2018-05-14 NOTE — Progress Notes (Signed)
Patient ID: Hannah Bond, female   DOB: Aug 29, 1955, 62 y.o.   MRN: [id number] EVENING ROUNDS NOTE :     [address]       [place] [address]             [phone]                 1 Day Post-Op Procedure(s) (LRB): MINIMALLY INVASIVE EXCISION OF  LEFT ATRIAL MYXOMA (Left) TRANSESOPHAGEAL ECHOCARDIOGRAM (TEE) (N/A)  Total Length of Stay:  LOS: 1 day  BP 124/75   Pulse 83   Temp 98.1 F (36.7 C) (Oral)   Resp 19   Ht 5\' 4"  (1.626 m)   Wt 60.8 kg   SpO2 95%   BMI 23.00 kg/m   .Intake/Output      09/25 0701 - 09/26 0700   P.O. 600   I.V. (mL/kg) 80 (1.3)   Blood    IV Piggyback    Total Intake(mL/kg) 680 (11.2)   Urine (mL/kg/hr) 1725 (2.3)   Blood    Chest Tube 140   Total Output 1865   Net -1185         . sodium chloride 20 mL (05/14/18 0919)  . cefUROXime (ZINACEF)  IV 1.5 g (05/14/18 0900)  . lactated ringers Stopped (05/13/18 1751)  . lactated ringers Stopped (05/13/18 1749)     Lab Results  Component Value Date   WBC 13.9 (H) 05/14/2018   HGB 9.4 (L) 05/14/2018   HCT 28.3 (L) 05/14/2018   PLT 147 (L) 05/14/2018   GLUCOSE 118 (H) 05/14/2018   ALT 17 05/09/2018   AST 22 05/09/2018   NA 136 05/14/2018   K 4.4 05/14/2018   CL 107 05/14/2018   CREATININE 0.52 05/14/2018   BUN 9 05/14/2018   CO2 21 (L) 05/14/2018   INR 1.38 05/13/2018   HGBA1C 5.6 05/09/2018   Stable , co incisional pain and right shoulder pain minimal ct output   Grace Isaac MD  Beeper [phone] Office [phone] 05/14/2018 7:10 PM

## 2018-05-14 NOTE — Discharge Instructions (Signed)
Discharge Instructions:  1. You may shower, please wash incisions daily with soap and water and keep dry.  If you wish to cover wounds with dressing you may do so but please keep clean and change daily.  No tub baths or swimming until incisions have completely healed.  If your incisions become red or develop any drainage please call our office at [phone]  2. No Driving until cleared by Dr. Guy Sandifer  office and you are no longer using narcotic pain medications  3. Monitor your weight daily.. Please use the same scale and weigh at same time... If you gain 5-10 lbs in 48 hours with associated lower extremity swelling, please contact our office at [phone]  4. Fever of 101.5 for at least 24 hours with no source, please contact our office at [phone]  5. Activity- up as tolerated, please walk at least 3 times per day.  Avoid strenuous activity for several weeks 6. If any questions or concerns arise, please do not hesitate to contact our office at [phone]

## 2018-05-15 ENCOUNTER — Inpatient Hospital Stay (HOSPITAL_COMMUNITY): Payer: BLUE CROSS/BLUE SHIELD

## 2018-05-15 LAB — GLUCOSE, CAPILLARY
GLUCOSE-CAPILLARY: 93 mg/dL (ref 70–99)
Glucose-Capillary: 101 mg/dL — ABNORMAL HIGH (ref 70–99)
Glucose-Capillary: 106 mg/dL — ABNORMAL HIGH (ref 70–99)
Glucose-Capillary: 91 mg/dL (ref 70–99)
Glucose-Capillary: 93 mg/dL (ref 70–99)

## 2018-05-15 LAB — CBC
HEMATOCRIT: 27.3 % — AB (ref 36.0–46.0)
HEMOGLOBIN: 9 g/dL — AB (ref 12.0–15.0)
MCH: 29.2 pg (ref 26.0–34.0)
MCHC: 33 g/dL (ref 30.0–36.0)
MCV: 88.6 fL (ref 78.0–100.0)
Platelets: 134 10*3/uL — ABNORMAL LOW (ref 150–400)
RBC: 3.08 MIL/uL — ABNORMAL LOW (ref 3.87–5.11)
RDW: 13.1 % (ref 11.5–15.5)
WBC: 11.1 10*3/uL — ABNORMAL HIGH (ref 4.0–10.5)

## 2018-05-15 LAB — BASIC METABOLIC PANEL
ANION GAP: 5 (ref 5–15)
BUN: 19 mg/dL (ref 8–23)
CALCIUM: 8 mg/dL — AB (ref 8.9–10.3)
CO2: 25 mmol/L (ref 22–32)
Chloride: 107 mmol/L (ref 98–111)
Creatinine, Ser: 0.66 mg/dL (ref 0.44–1.00)
GFR calc Af Amer: >60 mL/min (ref >60)
GFR calc non Af Amer: >60 mL/min (ref >60)
GLUCOSE: 105 mg/dL — AB (ref 70–99)
POTASSIUM: 4 mmol/L (ref 3.5–5.1)
Sodium: 137 mmol/L (ref 135–145)

## 2018-05-15 MED ORDER — SODIUM CHLORIDE 0.9% FLUSH: 9.0000 mL | INTRAVENOUS | Status: DC | PRN

## 2018-05-15 MED ORDER — NALOXONE HCL 0.4 MG/ML IJ SOLN: 0.4000 mg | INTRAMUSCULAR | Status: DC | PRN

## 2018-05-15 MED ORDER — ATORVASTATIN CALCIUM 40 MG PO TABS
40.0000 mg | ORAL_TABLET | Freq: Every day | ORAL | Status: DC
  Administered 2018-05-15 – 2018-05-18 (×4): 40 mg via ORAL
  Filled 2018-05-15 (×4): qty 1

## 2018-05-15 MED ORDER — DIPHENHYDRAMINE HCL 12.5 MG/5ML PO ELIX
12.5000 mg | ORAL_SOLUTION | Freq: Four times a day (QID) | ORAL | Status: DC | PRN
  Filled 2018-05-15: qty 5

## 2018-05-15 MED ORDER — FENTANYL 40 MCG/ML IV SOLN
INTRAVENOUS | Status: DC
  Administered 2018-05-15: 10 ug via INTRAVENOUS
  Administered 2018-05-16: 100 ug via INTRAVENOUS
  Administered 2018-05-16: 90 ug via INTRAVENOUS
  Administered 2018-05-16: 50 ug via INTRAVENOUS
  Filled 2018-05-15: qty 25

## 2018-05-15 MED ORDER — FENTANYL CITRATE (PF) 100 MCG/2ML IJ SOLN
25.0000 ug | Freq: Once | INTRAMUSCULAR | Status: AC
  Administered 2018-05-15: 25 ug via INTRAVENOUS
  Filled 2018-05-15: qty 2

## 2018-05-15 MED ORDER — ONDANSETRON HCL 4 MG/2ML IJ SOLN: 4.0000 mg | Freq: Four times a day (QID) | INTRAMUSCULAR | Status: DC | PRN

## 2018-05-15 MED ORDER — DIPHENHYDRAMINE HCL 50 MG/ML IJ SOLN: 12.5000 mg | Freq: Four times a day (QID) | INTRAMUSCULAR | Status: DC | PRN

## 2018-05-15 NOTE — Progress Notes (Addendum)
      [address]       [place] [address]             [phone]      2 Days Post-Op Procedure(s) (LRB): MINIMALLY INVASIVE EXCISION OF  LEFT ATRIAL MYXOMA (Left) TRANSESOPHAGEAL ECHOCARDIOGRAM (TEE) (N/A)   Subjective:  Daughter at bedside, provides translation.  Her biggest complaint is pain.  She states its really bad when she is up and trying to move around.  She does not like the Morphine as it makes her feel like she is burning all over. Requesting to try something else.  Objective: Vital signs in last 24 hours: Temp:  [97.7 F (36.5 C)-98.4 F (36.9 C)] 97.9 F (36.6 C) (09/26 0735) Pulse Rate:  [73-83] 77 (09/26 0626) Cardiac Rhythm: Normal sinus rhythm (09/26 0400) Resp:  [10-21] 18 (09/26 0626) BP: (91-131)/(55-78) 118/78 (09/26 0600) SpO2:  [91 %-100 %] 94 % (09/26 0626) Arterial Line BP: (101-119)/(49-55) 119/55 (09/25 0915) Weight:  [60 kg] 60 kg (09/26 0500)  Intake/Output from previous day: 09/25 0701 - 09/26 0700 In: 975 [P.O.:795; I.V.:80; IV Piggyback:100] Out: 2655 [Urine:2375; Chest Tube:280]  General appearance: alert, cooperative and no distress Heart: regular rate and rhythm Lungs: clear to auscultation bilaterally Abdomen: soft, non-tender; bowel sounds normal; no masses,  no organomegaly Extremities: extremities normal, atraumatic, no cyanosis or edema Wound: clean and dry  Lab Results: Recent Labs    05/14/18 1844 05/15/18 0231  WBC 14.8* 11.1*  HGB 10.4* 9.0*  HCT 31.3* 27.3*  PLT 153 134*   BMET:  Recent Labs    05/14/18 1844 05/15/18 0231  NA 137 137  K 4.3 4.0  CL 104 107  CO2 22 25  GLUCOSE 125* 105*  BUN 18 19  CREATININE 0.78 0.66  CALCIUM 8.5* 8.0*    PT/INR:  Recent Labs    05/13/18 1257  LABPROT 16.9*  INR 1.38   ABG    Component Value Date/Time   PHART 7.354 05/13/2018 1417   HCO3 23.7 05/13/2018 1417   TCO2 25 05/13/2018 1417   ACIDBASEDEF 2.0 05/13/2018 1417   O2SAT 94.0  05/13/2018 1417   CBG (last 3)  Recent Labs    05/14/18 2356 05/15/18 0350 05/15/18 0732  GLUCAP 106* 91 101*    Assessment/Plan: S/P Procedure(s) (LRB): MINIMALLY INVASIVE EXCISION OF  LEFT ATRIAL MYXOMA (Left) TRANSESOPHAGEAL ECHOCARDIOGRAM (TEE) (N/A)  1. CV- NSR, Bp controlled- continue ASA, low dose BB 2. Pulm-  No acute issues, off oxygen, CT output >200, ( level currently at 800 in pleurovac) will leave in place today, no effusions, pneumothorax on CXR 3. Renal- creatinine WNL, weight is trending down, continue lasix 40 mg daily 4. Expected post operative blood loss anemia, mild Hgb at 9.0 5. Dispo- patients biggest issue is pain, will d/c Morphine due to causing patient to feel like she is burning, will replace with Fentanyl and if needed can give her a low dose patch.  I think once her chest tubes are removed her pain will improve significantly, awaiting transfer to 4E   LOS: 2 days    Ellwood Handler 05/15/2018    Chart reviewed, patient examined, agree with above. Chest tube output low today and should be able to come out in the am.

## 2018-05-15 NOTE — Progress Notes (Signed)
Patient ID: Hannah Bond, female   DOB: [birth year], 62 y.o.   MRN: [id number] TCTS Evening Rounds:  Hemodynamically stable  Ambulated twice today. Pain still an issue. Urine output good Chest tube output low.  Awaiting bed on 4E.

## 2018-05-15 NOTE — Plan of Care (Signed)

## 2018-05-16 ENCOUNTER — Inpatient Hospital Stay (HOSPITAL_COMMUNITY): Payer: BLUE CROSS/BLUE SHIELD

## 2018-05-16 IMAGING — DX DG CHEST 1V PORT
1 series · 1 of 1 positions shown · non-contrast
Comparison: [DATE]

CLINICAL DATA: Follow-up myxoma resection

EXAM:
PORTABLE CHEST 1 VIEW

[chest ap]
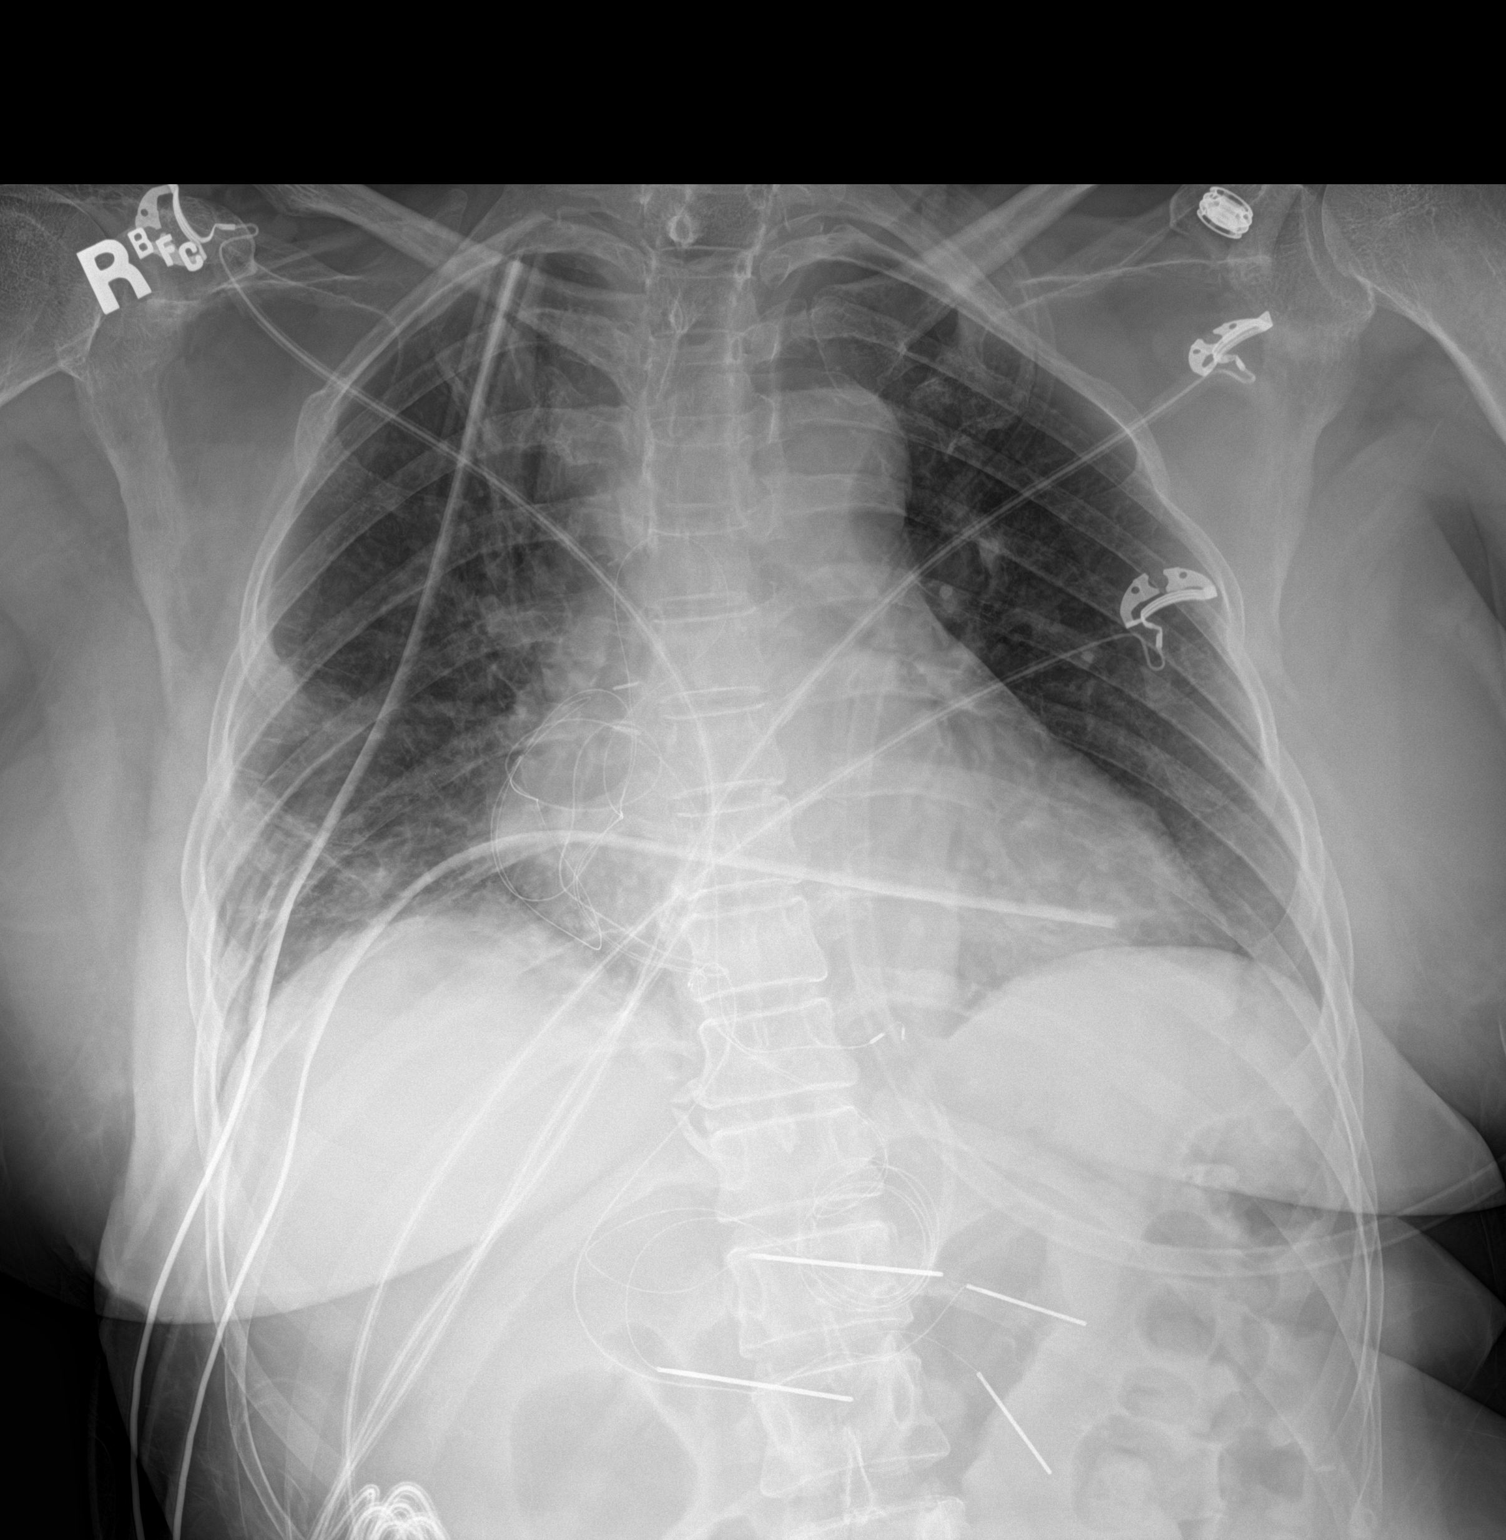

[1 of 1 positions shown; findings below may reference images not displayed]

FINDINGS: Cardiac shadow remains enlarged. Right chest tube and pericardial
drain are again identified and stable. Mild right basilar
atelectasis remains. No pneumothorax is noted. No focal abnormality
in the left lung is seen. No bony abnormality is noted.
IMPRESSION: Tubes and lines as described. Stable right basilar atelectasis is
noted.

## 2018-05-16 MED ORDER — KETOROLAC TROMETHAMINE 15 MG/ML IJ SOLN
15.0000 mg | Freq: Four times a day (QID) | INTRAMUSCULAR | Status: AC
  Administered 2018-05-16 (×2): 15 mg via INTRAVENOUS
  Filled 2018-05-16 (×2): qty 1

## 2018-05-16 MED ORDER — FENTANYL CITRATE (PF) 100 MCG/2ML IJ SOLN
12.5000 ug | Freq: Once | INTRAMUSCULAR | Status: AC
  Administered 2018-05-16: 12.5 ug via INTRAVENOUS
  Filled 2018-05-16: qty 2

## 2018-05-16 NOTE — Progress Notes (Signed)
Chest tube removed per provider order. Patient tolerated removal well. Tubes were removed without complication. Vaseline Gauze placed over removal area.  Q Pump removed per provider orders. Removed without complication, Patient tolerated well.  Vital Signs are stable. Patient denies pain at this time. Patient is resting comfortably in bed. I will continue to monitor patient.

## 2018-05-16 NOTE — Plan of Care (Signed)
  Problem: Education: Goal: Knowledge of General Education information will improve Description Including pain rating scale, medication(s)/side effects and non-pharmacologic comfort measures Outcome: Progressing   Problem: Health Behavior/Discharge Planning: Goal: Ability to manage health-related needs will improve Outcome: Progressing   

## 2018-05-16 NOTE — Progress Notes (Signed)
Pacing wires removed at this time. Patient tolerated well. Patient instructed on bedrest times 1 hour. Patient verbalized understanding. Will continue to monitor.  Emelda Fear, RN

## 2018-05-16 NOTE — Progress Notes (Signed)
CARDIAC REHAB PHASE I   Pt helped to [facility], declining walk at this time stating pain. Pt waiting to get her CT removed. Daughter at bedside. Will continue to follow.  [place], RN BSN 05/16/2018 2:00 PM

## 2018-05-16 NOTE — Progress Notes (Signed)
3 Days Post-Op Procedure(s) (LRB): MINIMALLY INVASIVE EXCISION OF  LEFT ATRIAL MYXOMA (Left) TRANSESOPHAGEAL ECHOCARDIOGRAM (TEE) (N/A) Subjective: Had a lot of pain last night after transfer, 10/10, crying etc even after oxy IR 10 and Toradol. She had received some fentanyl in ICU which helped. I put her on fentanyl PCA which has helped and she is much more comfortable. Daughter is with her.  Objective: Vital signs in last 24 hours: Temp:  [97.9 F (36.6 C)-98.7 F (37.1 C)] 98.2 F (36.8 C) (09/27 0844) Pulse Rate:  [77-95] 87 (09/27 0844) Cardiac Rhythm: Normal sinus rhythm (09/27 0700) Resp:  [14-31] 17 (09/27 0851) BP: (91-152)/(65-82) 119/69 (09/27 0844) SpO2:  [93 %-99 %] 97 % (09/27 0851) Weight:  [60.5 kg] 60.5 kg (09/26 2112)  Hemodynamic parameters for last 24 hours:    Intake/Output from previous day: 09/26 0701 - 09/27 0700 In: 368.6 [P.O.:320; IV Piggyback:48.6] Out: 1340 [Urine:1200; Chest Tube:140] Intake/Output this shift: No intake/output data recorded.  General appearance: alert and cooperative Neurologic: intact Heart: regular rate and rhythm, S1, S2 normal, no murmur, click, rub or gallop Lungs: clear to auscultation bilaterally Extremities: extremities normal, atraumatic, no cyanosis or edema Wound: dressing dry  Lab Results: Recent Labs    05/14/18 1844 05/15/18 0231  WBC 14.8* 11.1*  HGB 10.4* 9.0*  HCT 31.3* 27.3*  PLT 153 134*   BMET:  Recent Labs    05/14/18 1844 05/15/18 0231  NA 137 137  K 4.3 4.0  CL 104 107  CO2 22 25  GLUCOSE 125* 105*  BUN 18 19  CREATININE 0.78 0.66  CALCIUM 8.5* 8.0*    PT/INR:  Recent Labs    05/13/18 1257  LABPROT 16.9*  INR 1.38   ABG    Component Value Date/Time   PHART 7.354 05/13/2018 1417   HCO3 23.7 05/13/2018 1417   TCO2 25 05/13/2018 1417   ACIDBASEDEF 2.0 05/13/2018 1417   O2SAT 94.0 05/13/2018 1417   CBG (last 3)  Recent Labs    05/15/18 0732 05/15/18 1201 05/15/18 1521   GLUCAP 101* 93 93   CXR: clear  Assessment/Plan: S/P Procedure(s) (LRB): MINIMALLY INVASIVE EXCISION OF  LEFT ATRIAL MYXOMA (Left) TRANSESOPHAGEAL ECHOCARDIOGRAM (TEE) (N/A)  POD 3 She has been hemodynamically stable in sinus rhythm.  Chest tube output low so will remove both tubes which should help her pain. Follow up CXR in am. Continue PCA today until tubes out and see how pain is. On-Q is empty so remove. Wt is 5 lbs over preop. Continue lasix for now.    LOS: 3 days    Gaye Pollack 05/16/2018

## 2018-05-16 NOTE — Progress Notes (Signed)
Patient complaining of severe 10/10 pin unrelieved by PO pain meds.  MD notified and agreed to start PCA Fentanyl reduced dose. pca setup Patient and family educated about patient being the only one to push PCA button. Patient and family verbalized understanding,

## 2018-05-17 ENCOUNTER — Inpatient Hospital Stay (HOSPITAL_COMMUNITY): Payer: BLUE CROSS/BLUE SHIELD

## 2018-05-17 LAB — BASIC METABOLIC PANEL
Anion gap: 8 (ref 5–15)
BUN: 14 mg/dL (ref 8–23)
CALCIUM: 8.7 mg/dL — AB (ref 8.9–10.3)
CO2: 26 mmol/L (ref 22–32)
Chloride: 105 mmol/L (ref 98–111)
Creatinine, Ser: 0.62 mg/dL (ref 0.44–1.00)
Glucose, Bld: 95 mg/dL (ref 70–99)
POTASSIUM: 3.9 mmol/L (ref 3.5–5.1)
Sodium: 139 mmol/L (ref 135–145)

## 2018-05-17 LAB — CBC
HEMATOCRIT: 28.8 % — AB (ref 36.0–46.0)
HEMOGLOBIN: 9.6 g/dL — AB (ref 12.0–15.0)
MCH: 29.7 pg (ref 26.0–34.0)
MCHC: 33.3 g/dL (ref 30.0–36.0)
MCV: 89.2 fL (ref 78.0–100.0)
Platelets: UNDETERMINED 10*3/uL (ref 150–400)
RBC: 3.23 MIL/uL — AB (ref 3.87–5.11)
RDW: 12.8 % (ref 11.5–15.5)
WBC: 6.9 10*3/uL (ref 4.0–10.5)

## 2018-05-17 NOTE — Progress Notes (Addendum)
      [address]       [place] [address]             [phone]      4 Days Post-Op Procedure(s) (LRB): MINIMALLY INVASIVE EXCISION OF  LEFT ATRIAL MYXOMA (Left) TRANSESOPHAGEAL ECHOCARDIOGRAM (TEE) (N/A) Subjective: Her pain is much better after the chest tubes were removed yesteryday.   Objective: Vital signs in last 24 hours: Temp:  [98 F (36.7 C)-98.9 F (37.2 C)] 98.2 F (36.8 C) (09/28 0328) Pulse Rate:  [81-93] 81 (09/28 0328) Cardiac Rhythm: Normal sinus rhythm (09/27 2000) Resp:  [16-22] 16 (09/28 0328) BP: (107-128)/(69-81) 120/76 (09/28 0328) SpO2:  [93 %-100 %] 100 % (09/28 0328) Weight:  [60.5 kg] 60.5 kg (09/27 1900)     Intake/Output from previous day: 09/27 0701 - 09/28 0700 In: 480 [P.O.:480] Out: 600 [Urine:600] Intake/Output this shift: No intake/output data recorded.  General appearance: alert, cooperative and no distress Heart: regular rate and rhythm, S1, S2 normal, no murmur, click, rub or gallop Lungs: clear to auscultation bilaterally Abdomen: soft, non-tender; bowel sounds normal; no masses,  no organomegaly Extremities: extremities normal, atraumatic, no cyanosis or edema Wound: clean and dry  Lab Results: Recent Labs    05/15/18 0231 05/17/18 0451  WBC 11.1* 6.9  HGB 9.0* 9.6*  HCT 27.3* 28.8*  PLT 134* PLATELET CLUMPS NOTED ON SMEAR, UNABLE TO ESTIMATE   BMET:  Recent Labs    05/15/18 0231 05/17/18 0451  NA 137 139  K 4.0 3.9  CL 107 105  CO2 25 26  GLUCOSE 105* 95  BUN 19 14  CREATININE 0.66 0.62  CALCIUM 8.0* 8.7*    PT/INR: No results for input(s): LABPROT, INR in the last 72 hours. ABG    Component Value Date/Time   PHART 7.354 05/13/2018 1417   HCO3 23.7 05/13/2018 1417   TCO2 25 05/13/2018 1417   ACIDBASEDEF 2.0 05/13/2018 1417   O2SAT 94.0 05/13/2018 1417   CBG (last 3)  Recent Labs    05/15/18 0732 05/15/18 1201 05/15/18 1521  GLUCAP 101* 93 93    Assessment/Plan: S/P  Procedure(s) (LRB): MINIMALLY INVASIVE EXCISION OF  LEFT ATRIAL MYXOMA (Left) TRANSESOPHAGEAL ECHOCARDIOGRAM (TEE) (N/A)  1. CV-NSR in the 80s-90s, BP well controlled. Continue ASA, statin. Continue metoprolol. Will add ACEI before discharge if appropriate.  2. Pulm-Await 2-view CXR this morning. Chest tubes removed yesterday. Tolerating room air with excellent saturation. 3. Renal-creatinine 0.62, electrolytes okay 4. H and H stable at 9.6/28.8, expected acute blood loss anemia 5. Blood glucose well controlled on current regimen 6. Pain control has been a challenge. Discontinue PCA today and try only oral pain medication.   Plan: Discontinue PCA today and try just oral medication for pain. Ambulate in the halls several times today and work on incentive spirometer.    LOS: 4 days    Elgie Collard 05/17/2018 Patient seen and examined, agree with above  Remo Lipps C. Roxan Hockey, MD [facility] [phone]

## 2018-05-17 NOTE — Progress Notes (Signed)
CARDIAC REHAB PHASE I   PRE:  Rate/Rhythm: 93 SR  BP:  Sitting: 121/74      SaO2: 97 RA  MODE:  Ambulation: 350 ft   POST:  Rate/Rhythm: 98 SR  BP:  Sitting: 100/73    SaO2: 98 RA   Pt helped to BR, then ambulated 334ft in hallway assist of one with front wheel walker. Pt took several very short rest breaks c/o pain at incision site. Pt returned to recliner. Family at bedside. RN aware of pts request to get cleaned up. Did not complete education as daughter not at bedside and pt states she is not going home til Monday. Will continue to follow.  [id number] Rufina Falco, RN BSN 05/17/2018 2:42 PM

## 2018-05-17 NOTE — Progress Notes (Signed)
PCA pump discontinued per provider orders. Wasted 95mcg / 48mL Fentanyl in sink with Second Nurse, Clyde Canterbury.

## 2018-05-18 MED ORDER — METOPROLOL TARTRATE 25 MG PO TABS
25.0000 mg | ORAL_TABLET | Freq: Two times a day (BID) | ORAL | Status: DC
  Administered 2018-05-18 – 2018-05-19 (×2): 25 mg via ORAL
  Filled 2018-05-18 (×2): qty 1

## 2018-05-18 NOTE — Progress Notes (Addendum)
      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   Objective: Vital signs in last 24 hours: Temp:  [98.1 F (36.7 C)-98.8 F (37.1 C)] 98.5 F (36.9 C) (09/29 0458) Pulse Rate:  [75-108] 107 (09/29 0458) Cardiac Rhythm: Normal sinus rhythm (09/28 1900) Resp:  [11-23] 20 (09/29 0458) BP: (103-136)/(65-82) 108/68 (09/29 0458) SpO2:  [94 %-100 %] 94 % (09/29 0458) Weight:  [56.4 kg] 56.4 kg (09/29 0458)     Intake/Output from previous day: 09/28 0701 - 09/29 0700 In: 240 [P.O.:240] Out: -  Intake/Output this shift: No intake/output data recorded.  General appearance: alert, cooperative and no distress Heart: regular rate and rhythm, S1, S2 normal, no murmur, click, rub or gallop Lungs: clear to auscultation bilaterally Abdomen: soft, non-tender; bowel sounds normal; no masses,  no organomegaly Extremities: extremities normal, atraumatic, no cyanosis or edema Wound: clean and dry  Lab Results: Recent Labs    05/17/18 0451  WBC 6.9  HGB 9.6*  HCT 28.8*  PLT PLATELET CLUMPS NOTED ON SMEAR, UNABLE TO ESTIMATE   BMET:  Recent Labs    05/17/18 0451  NA 139  K 3.9  CL 105  CO2 26  GLUCOSE 95  BUN 14  CREATININE 0.62  CALCIUM 8.7*    PT/INR: No results for input(s): LABPROT, INR in the last 72 hours. ABG    Component Value Date/Time   PHART 7.354 05/13/2018 1417   HCO3 23.7 05/13/2018 1417   TCO2 25 05/13/2018 1417   ACIDBASEDEF 2.0 05/13/2018 1417   O2SAT 94.0 05/13/2018 1417   CBG (last 3)  Recent Labs    05/15/18 1201 05/15/18 1521  GLUCAP 93 93    Assessment/Plan: S/P Procedure(s) (LRB): MINIMALLY INVASIVE EXCISION OF  LEFT ATRIAL MYXOMA (Left) TRANSESOPHAGEAL  ECHOCARDIOGRAM (TEE) (N/A)   1. CV-NSR in the 90s-100s, BP well controlled. Continue ASA, statin. Continue metoprolol. Will add ACEI before discharge if appropriate.  2. Pulm-Trace right pneumothorax on CXR, right atelectasis. Tolerating room air with excellent saturation. 3. Renal-creatinine 0.62, electrolytes okay 4. H and H stable at 9.6/28.8, expected acute blood loss anemia 5. Blood glucose well controlled on current regimen  Plan: work on pain control with oral medications today. Ambulate several times today. Encouraged incentive spirometer. Increase oral intake.     LOS: 5 days    Elgie Collard 05/18/2018 Patient seen and examined, agree with above  Remo Lipps C. Roxan Hockey, MD [facility] [phone]

## 2018-05-19 MED ORDER — OXYCODONE HCL 5 MG PO TABS: 10.0000 mg | ORAL_TABLET | ORAL | 0 refills | Status: DC | PRN

## 2018-05-19 MED ORDER — OXYCODONE HCL 10 MG PO TABS: 10.0000 mg | ORAL_TABLET | ORAL | 0 refills | Status: DC | PRN

## 2018-05-19 MED ORDER — METOPROLOL TARTRATE 25 MG PO TABS: 25.0000 mg | ORAL_TABLET | Freq: Two times a day (BID) | ORAL | 3 refills | Status: AC

## 2018-05-19 MED ORDER — ASPIRIN 325 MG PO TBEC: 325.0000 mg | DELAYED_RELEASE_TABLET | Freq: Every day | ORAL | 0 refills | Status: AC

## 2018-05-19 MED FILL — oxyCODONE HCL 5 MG TABS: 5 | 4 days supply | Qty: 40 | Fill #0

## 2018-05-19 NOTE — Progress Notes (Addendum)
      [address]       [place] [address]             [phone]      6 Days Post-Op Procedure(s) (LRB): MINIMALLY INVASIVE EXCISION OF  LEFT ATRIAL MYXOMA (Left) TRANSESOPHAGEAL ECHOCARDIOGRAM (TEE) (N/A)   Subjective:  No new complaints.  Continues to have pain, but is improving.  + ambulation  + BM  Objective: Vital signs in last 24 hours: Temp:  [98.1 F (36.7 C)-98.6 F (37 C)] 98.6 F (37 C) (09/30 0533) Pulse Rate:  [103-122] 103 (09/30 0533) Cardiac Rhythm: Sinus tachycardia;Normal sinus rhythm (09/30 0533) Resp:  [17-20] 17 (09/30 0533) BP: (91-116)/(60-73) 91/60 (09/30 0533) SpO2:  [97 %-100 %] 97 % (09/30 0533) Weight:  [56.2 kg] 56.2 kg (09/30 0403)  Intake/Output from previous day: 09/29 0701 - 09/30 0700 In: 1110 [P.O.:1110] Out: -   General appearance: alert, cooperative and no distress Heart: regular rate and rhythm Lungs: clear to auscultation bilaterally Abdomen: soft, non-tender; bowel sounds normal; no masses,  no organomegaly Extremities: edema trace Wound: clean and dry  Lab Results: Recent Labs    05/17/18 0451  WBC 6.9  HGB 9.6*  HCT 28.8*  PLT PLATELET CLUMPS NOTED ON SMEAR, UNABLE TO ESTIMATE   BMET:  Recent Labs    05/17/18 0451  NA 139  K 3.9  CL 105  CO2 26  GLUCOSE 95  BUN 14  CREATININE 0.62  CALCIUM 8.7*    PT/INR: No results for input(s): LABPROT, INR in the last 72 hours. ABG    Component Value Date/Time   PHART 7.354 05/13/2018 1417   HCO3 23.7 05/13/2018 1417   TCO2 25 05/13/2018 1417   ACIDBASEDEF 2.0 05/13/2018 1417   O2SAT 94.0 05/13/2018 1417   CBG (last 3)  No results for input(s): GLUCAP in the last 72 hours.  Assessment/Plan: S/P Procedure(s) (LRB): MINIMALLY INVASIVE EXCISION OF  LEFT ATRIAL MYXOMA (Left) TRANSESOPHAGEAL ECHOCARDIOGRAM (TEE) (N/A)  1. CV- Sinus Tach, mild- continue Lopressor 2. Pulm- no acute issues, continue IS 3. Renal-creatinine has been stable, no  edema on exam, no lasix at this time 4. Pain control- relief with Oxy 5. Dispo- patient stable, will d/c home today   LOS: 6 days    Erin Barrett 05/19/2018   I have seen and examined the patient and agree with the assessment and plan as outlined.  D/C home today  Rexene Alberts, MD 05/19/2018 8:57 AM

## 2018-05-19 NOTE — Progress Notes (Signed)
D/c instructions given to patient and daughter. Wound care instructions given- including to wash with soap and water and not to apply lotions or powders. IV and telemetry removed. RW to be taken home. Daughter to escort pt home.  Clyde Canterbury, RN

## 2018-05-19 NOTE — Care Management Note (Signed)
Case Management Note Marvetta Gibbons RN, BSN Unit 4E- RN Care Coordinator  [phone]  Patient Details  Name: Hannah Bond MRN: [id number] Date of Birth: 03-20-1956  Subjective/Objective:   Pt admitted s/p mini resection of myxoma                 Action/Plan: PTA pt lived at home with family, RW needed for transition home. Order placed, call made to [facility] with [facility] for DME need- RW to be delivered to room prior to discharge.   Expected Discharge Date:  05/19/18               Expected Discharge Plan:  Home/Self Care  In-House Referral:  NA  Discharge planning Services  CM Consult  Post Acute Care Choice:  Durable Medical Equipment Choice offered to:  Patient  DME Arranged:  [facility]ford Rile rolling DME Agency:  [place]:  NA [place] Agency:  NA  Status of Service:  Completed, signed off  If discussed at [place] of Stay Meetings, dates discussed:    Discharge Disposition: home/self care   Additional Comments:  Dawayne Patricia, RN 05/19/2018, 12:27 PM

## 2018-05-19 NOTE — Progress Notes (Signed)
CARDIAC REHAB PHASE I   D/c education completed with pt and daughter. Pt instructed to shower daily and monitor incisions. Pt instructed to continue IS use. Pt given exercise guidelines and encouraged to get up and moving everyday. RN to order front wheel walker. No further questions at this time.  [id number] Rufina Falco, RN BSN 05/19/2018 10:36 AM

## 2018-05-20 LAB — BPAM RBC
Blood Product Expiration Date: 201910032359
Blood Product Expiration Date: 201910122359
ISSUE DATE / TIME: 201909240938
ISSUE DATE / TIME: 201909291459
UNIT TYPE AND RH: 7300
Unit Type and Rh: 7300

## 2018-05-20 LAB — TYPE AND SCREEN
ABO/RH(D): B POS
ANTIBODY SCREEN: NEGATIVE
Unit division: 0
Unit division: 0

## 2018-05-23 ENCOUNTER — Other Ambulatory Visit: Payer: Self-pay

## 2018-05-23 ENCOUNTER — Ambulatory Visit (INDEPENDENT_AMBULATORY_CARE_PROVIDER_SITE_OTHER): Payer: Self-pay

## 2018-05-23 DIAGNOSIS — Z4801 Encounter for change or removal of surgical wound dressing: Secondary | ICD-10-CM

## 2018-05-23 DIAGNOSIS — Z5189 Encounter for other specified aftercare: Secondary | ICD-10-CM

## 2018-05-26 ENCOUNTER — Other Ambulatory Visit: Payer: Self-pay

## 2018-05-26 MED ORDER — TRAMADOL HCL 50 MG PO TABS: 50.0000 mg | ORAL_TABLET | Freq: Four times a day (QID) | ORAL | 0 refills | Status: DC | PRN

## 2018-05-30 ENCOUNTER — Other Ambulatory Visit: Payer: Self-pay | Admitting: Thoracic Surgery (Cardiothoracic Vascular Surgery)

## 2018-05-30 DIAGNOSIS — D151 Benign neoplasm of heart: Secondary | ICD-10-CM

## 2018-06-02 ENCOUNTER — Encounter: Payer: Self-pay | Admitting: Thoracic Surgery (Cardiothoracic Vascular Surgery)

## 2018-06-02 ENCOUNTER — Ambulatory Visit (INDEPENDENT_AMBULATORY_CARE_PROVIDER_SITE_OTHER): Payer: Self-pay | Admitting: Thoracic Surgery (Cardiothoracic Vascular Surgery)

## 2018-06-02 ENCOUNTER — Ambulatory Visit
Admission: RE | Admit: 2018-06-02 | Discharge: 2018-06-02 | Disposition: A | Discharge status: Home / Self Care | Payer: BLUE CROSS/BLUE SHIELD | Source: Ambulatory Visit | Attending: Thoracic Surgery (Cardiothoracic Vascular Surgery) | Admitting: Thoracic Surgery (Cardiothoracic Vascular Surgery)

## 2018-06-02 VITALS — BP 100/58 | HR 114 | Resp 20 | Ht 64.0 in | Wt 123.0 lb

## 2018-06-02 DIAGNOSIS — Z86018 Personal history of other benign neoplasm: Secondary | ICD-10-CM

## 2018-06-02 DIAGNOSIS — Z09 Encounter for follow-up examination after completed treatment for conditions other than malignant neoplasm: Secondary | ICD-10-CM

## 2018-06-02 DIAGNOSIS — D151 Benign neoplasm of heart: Secondary | ICD-10-CM

## 2018-06-02 NOTE — Progress Notes (Signed)
[address]       [place] [address]             [phone]     CARDIOTHORACIC SURGERY OFFICE NOTE  Referring Provider is Raelyn Number, MD  Primary Cardiologist is Adrian Prows, MD PCP is Patient, No Pcp Per   HPI:  Patient is a 62 year old female originally from [place] who returns to the office today for routine follow-up status post minimally invasive resection of left atrial myxoma via right minithoracotomy approach on May 13, 2018.  The patient's early postoperative recovery was uneventful and she was discharged home on the sixth postoperative day.  Final pathology from the resected mass was consistent with benign myxoma.  Patient returns to the office today for routine follow-up and is accompanied by her daughter who translates.  The patient is reportedly doing well.  She has mild residual soreness in her chest exacerbated by specific movements, such as reaching with the right arm.  She also has some pain along the medial aspect of her right thigh extending down the right leg.  This is associated with numbness in the same region, and suspicious for likely neuropathic pain related to her right groin incision.  Symptoms are not suggestive of peripheral arterial insufficiency.  Patient is reportedly eating reasonably well.  She is ambulatory but not doing a whole lot of walking.  She has no other complaints and she specifically denies shortness of breath.   Current Outpatient Medications  Medication Sig Dispense Refill  . aspirin EC 325 MG EC tablet Take 1 tablet (325 mg total) by mouth daily. 30 tablet 0  . atorvastatin (LIPITOR) 40 MG tablet Take 40 mg by mouth at bedtime.     . metoprolol tartrate (LOPRESSOR) 25 MG tablet Take 1 tablet (25 mg total) by mouth 2 (two) times daily. 60 tablet 3  . Multiple Vitamin (MULTIVITAMIN WITH MINERALS) TABS tablet Take 1 tablet by mouth daily.    . traMADol (ULTRAM) 50 MG tablet Take 1 tablet (50 mg total) by mouth  every 6 (six) hours as needed. 50 tablet 0   No current facility-administered medications for this visit.       Physical Exam:   BP (!) 100/58   Pulse (!) 114   Resp 20   Ht 5\' 4"  (1.626 m)   Wt 123 lb (55.8 kg)   SpO2 99% Comment: RA  BMI 21.11 kg/m   General:  Well-appearing  Chest:   Clear to auscultation  CV:   Regular rate and rhythm without murmur  Incisions:  Healing nicely, chest tube sutures are removed  Abdomen:  Soft nontender  Extremities:  Warm and well-perfused, palpable pulses right lower leg  Diagnostic Tests:  CHEST - 2 VIEW  COMPARISON:  05/17/2018  FINDINGS: Mild elevation of the right diaphragmatic leaflet. Improved aeration at the right lung base. Left lung remains clear.  Heart size and mediastinal contours are within normal limits.  No effusion.  No pneumothorax.  Visualized bones unremarkable.  IMPRESSION: 1. Mildly elevated right hemidiaphragm.  Otherwise negative.   Electronically Signed   By: Lucrezia Europe M.D.   On: 06/02/2018 11:23   Impression:  Patient is doing well status post resection of left atrial myxoma via right minithoracotomy approach  Plan:  I have encouraged the patient to continue to increase her physical activity as tolerated with her primary limitation remaining that she refrain from heavy lifting or strenuous use of her arms  or shoulders or reaching above her head with her right arm for at least another 4 to 6 weeks.  I have encouraged the patient to walk more.  We have not recommended any change the patient's current medications.  The patient might benefit from participation in outpatient cardiac rehab program.  The patient is planning a trip to [place] sometime after the holidays.  I think this should be fine but the patient will return to our office for follow-up prior to her trip in approximately 2 months.  All questions answered.    Valentina Gu. Roxy Manns, MD 06/02/2018 11:29 AM

## 2018-06-02 NOTE — Patient Instructions (Addendum)
You may continue to gradually increase your physical activity as tolerated.  Refrain from any heavy lifting or strenuous use of your arms and shoulders until at least 8 weeks from the time of your surgery, and avoid activities that cause increased pain in your chest on the side of your surgical incision.  Otherwise you may continue to increase activities without any particular limitations.  Increase the intensity and duration of physical activity gradually.  Continue all previous medications without any changes at this time  You are encouraged to enroll and participate in the outpatient cardiac rehab program beginning as soon as practical.

## 2018-06-30 ENCOUNTER — Encounter (HOSPITAL_COMMUNITY): Payer: Self-pay | Admitting: Cardiology

## 2018-07-23 ENCOUNTER — Telehealth: Payer: Self-pay

## 2018-07-23 NOTE — Telephone Encounter (Signed)
Patient's daughter contacted the office concerned about patient having "some chest pain".  She stated that it happens a couple times a day but quickly goes away.  I advised her to go to the ED or contact 911 if she is experiencing severe pain.  She denied severe pain.  I did advise that she contact patient's Cardiologist to make an appointment regarding these symptoms.  Patient is s/p removal of atrial myxoma 04/2018 with Dr. Roxy Manns.  Patient's daughter stated that she has not seen her Cardiologist since surgery because, "she was not told to".    Patient's daughter also requested for pain medication.  I advised that patient should not require more pain medication at this point and she should take over-the-counter Tylenol and Ibuprofen.  She acknowledged receipt.  She is aware of mother's appointment 08/04/18 with Dr. Roxy Manns.

## 2018-08-04 ENCOUNTER — Ambulatory Visit (INDEPENDENT_AMBULATORY_CARE_PROVIDER_SITE_OTHER): Payer: Self-pay | Admitting: Thoracic Surgery (Cardiothoracic Vascular Surgery)

## 2018-08-04 ENCOUNTER — Other Ambulatory Visit: Payer: Self-pay

## 2018-08-04 ENCOUNTER — Encounter: Payer: Self-pay | Admitting: Thoracic Surgery (Cardiothoracic Vascular Surgery)

## 2018-08-04 VITALS — BP 116/78 | HR 103 | Resp 18 | Ht 64.0 in | Wt 127.8 lb

## 2018-08-04 DIAGNOSIS — Z86018 Personal history of other benign neoplasm: Secondary | ICD-10-CM

## 2018-08-04 NOTE — Patient Instructions (Addendum)
Continue all previous medications without any changes at this time  You may resume unrestricted physical activity without any particular limitations at this time.  Avoid movements or activities that cause pain at your incision.

## 2018-08-04 NOTE — Progress Notes (Signed)
[address]       [place] [address]             [phone]     CARDIOTHORACIC SURGERY OFFICE NOTE  Referring Provider is Raelyn Number, MD  Primary Cardiologist is Adrian Prows, MD PCP is Patient, No Pcp Per   HPI:  Patient is a 62 year old female originally from [place] who returns to the office today for routine follow-up status post minimally invasive resection of left atrial myxoma via right minithoracotomy approach on May 13, 2018.  The patient's early postoperative recovery was uneventful and she was discharged home on the sixth postoperative day.  Final pathology from the resected mass was consistent with benign myxoma.  She was last seen here in our office on June 02, 2018 at which time she was doing well.  She returns her office today and is accompanied by her daughter who serves as an Astronomer.  She states that she is doing very well.  She has no shortness of breath.  She still has transient sharp fleeting pains across her chest that seem to come and go.  For the most part they are all associated with her surgical incision although she does have some upper back pain as well.  Other than that she is doing very well.  Activity level is good but she is not working or helping around the house.  Her daughter has tried to get her to start cooking.  She is eating well and sleeping well.  She hopes to go to [place] in the near future.   Current Outpatient Medications  Medication Sig Dispense Refill  . aspirin EC 325 MG EC tablet Take 1 tablet (325 mg total) by mouth daily. 30 tablet 0  . atorvastatin (LIPITOR) 40 MG tablet Take 40 mg by mouth at bedtime.     . metoprolol tartrate (LOPRESSOR) 25 MG tablet Take 1 tablet (25 mg total) by mouth 2 (two) times daily. 60 tablet 3  . Multiple Vitamin (MULTIVITAMIN WITH MINERALS) TABS tablet Take 1 tablet by mouth daily.     No current facility-administered medications for this visit.       Physical  Exam:   BP 116/78 (BP Location: Left Arm, Patient Position: Sitting, Cuff Size: Normal)   Pulse (!) 103   Resp 18   Ht 5\' 4"  (1.626 m)   Wt 127 lb 12.8 oz (58 kg)   SpO2 97% Comment: RA  BMI 21.94 kg/m   General:  Well-appearing  Chest:   Clear to auscultation with symmetric breath sounds  CV:   Regular rate and rhythm  Incisions:  Well-healed  Abdomen:  Soft nontender  Extremities:  Warm and well-perfused  Diagnostic Tests:  n/a   Impression:  Patient is doing well approximately 30-month status post minimally invasive resection of left atrial myxoma via right minithoracotomy approach.  Plan:  I have encouraged the patient to continue to increase her physical activity without any particular limitations.  I suggested that she might benefit from more aerobic activity such as walking on a regular basis.  We have not recommended any changes to her current medications.  I have reassured the patient and her daughter that some mild occasional transient pains across her chest related to her surgical incision are not unusual at this point.  Her surgical incision is healing nicely and she has no physical limitations.  Over-the-counter pain relievers should suffice.  Some topical agents can be utilized as well.  All questions answered.  The patient will return to our office next fall, approximately 1 year following the surgery.  She will call and return sooner should specific problems or questions arise.    Valentina Gu. Roxy Manns, MD 08/04/2018 2:38 PM

## 2019-04-16 ENCOUNTER — Telehealth: Payer: Self-pay

## 2019-04-16 NOTE — Telephone Encounter (Signed)
Patient's daughter, Dorene Sorrow contacted the office with concerns of her mother experiencing chest pain.  She stated that it is not severe chest pain to which she would need to go to the hospital for.  She stated that her mother has a follow-up appointment with Dr. Roxy Manns next month for a follow-up and was requesting advise.  I advised her to contact patient's Cardiologist, Dr. Einar Gip for an appointment for evaluation.  She acknowledged receipt.

## 2019-05-11 ENCOUNTER — Other Ambulatory Visit: Payer: Self-pay

## 2019-05-11 ENCOUNTER — Telehealth (INDEPENDENT_AMBULATORY_CARE_PROVIDER_SITE_OTHER): Payer: BLUE CROSS/BLUE SHIELD | Admitting: Thoracic Surgery (Cardiothoracic Vascular Surgery)

## 2019-05-11 DIAGNOSIS — Z86018 Personal history of other benign neoplasm: Secondary | ICD-10-CM

## 2019-05-11 NOTE — Progress Notes (Signed)
      [address]       [place] [address]             [phone]     CARDIOTHORACIC SURGERY TELEPHONE VIRTUAL OFFICE NOTE  Referring Provider is Hague, Rosalyn Charters, MD Primary Cardiologist is Adrian Prows, MD   HPI:  I spoke with Josephina Gip, the daughter of Madalyn A Hewell (DOB [birth year] ) via telephone on 05/11/2019 at 3:15 PM and verified that I was speaking with the correct person using more than one form of identification.  The patient does not speak any English and her daughter translates for her.  We discussed the reason(s) for conducting our visit virtually instead of in-person.  The patient expressed understanding the circumstances and agreed to proceed as described.  Patient is a 63 year old female originally from [place] who underwent minimally invasive resection of left atrial myxoma via right minithoracotomy approach on May 13, 2018.  She was last seen here in our office on August 04, 2018 at which time she was doing well.  According to the patient's daughter, the patient continues to do very well.  Her only complaints is that she still occasionally will have some burning pain in the area of her surgical incision.  This occurs only sporadically and usually is precipitated by some type of physical activity.  It is clearly located to the chest wall.  She states that she does get tired occasionally, particularly if she has been on her feet for several hours at a time cooking.  Otherwise she is doing quite well.    Current Outpatient Medications  Medication Sig Dispense Refill  . aspirin EC 325 MG EC tablet Take 1 tablet (325 mg total) by mouth daily. 30 tablet 0  . atorvastatin (LIPITOR) 40 MG tablet Take 40 mg by mouth at bedtime.     . metoprolol tartrate (LOPRESSOR) 25 MG tablet Take 1 tablet (25 mg total) by mouth 2 (two) times daily. 60 tablet 3  . Multiple Vitamin (MULTIVITAMIN WITH MINERALS) TABS tablet Take 1 tablet by mouth daily.     No current  facility-administered medications for this visit.      Diagnostic Tests:  n/a   Impression:  Patient sounds to be doing very well approximately 1 year following resection of left atrial myxoma  Plan:  We have not recommended any change the patient's current medications.  I have explained to the patient's daughter that at this point she should have no physical limitations.  She requested that she bring her mother in to our office for a follow-up appointment at some point in the future when concerns about COVID-19 have subsided.  They will call and request an appointment as soon as they are comfortable with an in person visit.    I discussed limitations of evaluation and management via telephone.  The patient was advised to call back for repeat telephone consultation or to seek an in-person evaluation if questions arise or the patient's clinical condition changes in any significant manner.  I spent in excess of 5 minutes of non-face-to-face time during the conduct of this telephone virtual office consultation.    Valentina Gu. Roxy Manns, MD 05/11/2019 3:15 PM

## 2019-05-11 NOTE — Patient Instructions (Signed)
Continue all previous medications without any changes at this time  

## 2019-05-21 ENCOUNTER — Ambulatory Visit (INDEPENDENT_AMBULATORY_CARE_PROVIDER_SITE_OTHER): Payer: BLUE CROSS/BLUE SHIELD | Admitting: Cardiology

## 2019-05-21 DIAGNOSIS — Z5329 Procedure and treatment not carried out because of patient's decision for other reasons: Secondary | ICD-10-CM

## 2019-05-21 NOTE — Progress Notes (Signed)
No show

## 2019-05-22 DIAGNOSIS — Z91199 Patient's noncompliance with other medical treatment and regimen due to unspecified reason: Secondary | ICD-10-CM | POA: Insufficient documentation

## 2019-05-22 DIAGNOSIS — Z5329 Procedure and treatment not carried out because of patient's decision for other reasons: Secondary | ICD-10-CM | POA: Insufficient documentation

## 2019-08-28 ENCOUNTER — Ambulatory Visit: Payer: BLUE CROSS/BLUE SHIELD | Attending: Internal Medicine

## 2019-08-28 DIAGNOSIS — Z20822 Contact with and (suspected) exposure to covid-19: Secondary | ICD-10-CM

## 2019-08-30 LAB — NOVEL CORONAVIRUS, NAA: SARS-CoV-2, NAA: NOT DETECTED

## 2019-09-08 ENCOUNTER — Other Ambulatory Visit: Payer: BLUE CROSS/BLUE SHIELD

## 2022-01-10 ENCOUNTER — Ambulatory Visit
Admission: RE | Admit: 2022-01-10 | Discharge: 2022-01-10 | Disposition: A | Discharge status: Home / Self Care | Payer: Medicare Other | Source: Ambulatory Visit | Attending: Internal Medicine | Admitting: Internal Medicine

## 2022-01-10 ENCOUNTER — Other Ambulatory Visit: Payer: Self-pay | Admitting: Internal Medicine

## 2022-01-10 DIAGNOSIS — R079 Chest pain, unspecified: Secondary | ICD-10-CM

## 2022-01-10 MED ORDER — IOPAMIDOL (ISOVUE-300) INJECTION 61%
75.0000 mL | Freq: Once | INTRAVENOUS | Status: AC | PRN
  Administered 2022-01-10: 75 mL via INTRAVENOUS

## 2023-06-07 ENCOUNTER — Ambulatory Visit
Admission: EM | Admit: 2023-06-07 | Discharge: 2023-06-07 | Disposition: A | Discharge status: Home / Self Care | Payer: 59 | Attending: Physician Assistant | Admitting: Physician Assistant

## 2023-06-07 DIAGNOSIS — M62830 Muscle spasm of back: Secondary | ICD-10-CM | POA: Diagnosis not present

## 2023-06-07 DIAGNOSIS — S39012A Strain of muscle, fascia and tendon of lower back, initial encounter: Secondary | ICD-10-CM

## 2023-06-07 MED ORDER — BACLOFEN 10 MG PO TABS: 10.0000 mg | ORAL_TABLET | Freq: Two times a day (BID) | ORAL | 0 refills | Status: AC | PRN

## 2023-06-07 MED ORDER — IBUPROFEN 800 MG PO TABS
800.0000 mg | ORAL_TABLET | Freq: Once | ORAL | Status: AC
  Administered 2023-06-07: 800 mg via ORAL

## 2023-06-07 MED ORDER — LIDOCAINE 5 % EX PTCH: 1.0000 | MEDICATED_PATCH | CUTANEOUS | 0 refills | Status: AC

## 2023-06-07 MED ORDER — IBUPROFEN 600 MG PO TABS: 600.0000 mg | ORAL_TABLET | Freq: Three times a day (TID) | ORAL | 0 refills | Status: AC | PRN

## 2023-06-07 NOTE — ED Triage Notes (Signed)
Pt presnts to UC w/ daughter w/ c/o lower back pain x3 days, worse on the sides. Pt took advil w/o relief. Denies falling or direct injury.

## 2023-06-07 NOTE — ED Provider Notes (Signed)
[facility]    CSN: [id number] Arrival date & time: 06/07/23  1114      History   Chief Complaint Chief Complaint  Patient presents with   Back Pain    HPI Hannah Bond is a 67 y.o. female.   Patient presents today accompanied by her daughter who provides history and translation.  Reports a 2-day history of bilateral lower back pain.  Reports symptoms began after she sat up in bed and denies any recent trauma, change in activity, recent fall.  She denies previous injury or surgery involving her spine.  Has tried ibuprofen without improvement of symptoms.  She denies any bowel/bladder incontinence, lower extremity weakness, saddle anesthesia.  She is having difficulty with daily activities as changing positions is extremely painful and she requires assistance.  Denies personal history of malignancy; has history of myxoma but this was benign and removed several years ago.  Denies any urinary symptoms.  Denies any abdominal pain, nausea, vomiting, fever.  Pain is rated 10 on a 0-10 pain scale, localized to her lower back without radiation, described as sharp, worse with certain movements or changing position, no alleviating factors identified.    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   Myxoma of heart    Chest pain    Hyperlipidemia    Vitamin D deficiency     Past Surgical History:  Procedure Laterality Date   CARDIAC CATHETERIZATION     MINIMALLY INVASIVE EXCISION OF ATRIAL MYXOMA Left 05/13/2018   Procedure: MINIMALLY INVASIVE EXCISION OF  LEFT ATRIAL MYXOMA;  Surgeon: Purcell Nails, MD;  Location: [facility] OR;  Service: Open Heart Surgery;  Laterality:  Left;   RIGHT/LEFT HEART CATH AND CORONARY ANGIOGRAPHY N/A 04/29/2018   Procedure: RIGHT/LEFT HEART CATH AND CORONARY ANGIOGRAPHY;  Surgeon: Yates Decamp, MD;  Location: [facility] INVASIVE CV LAB;  Service: Cardiovascular;  Laterality: N/A;   TEE WITHOUT CARDIOVERSION N/A 05/13/2018   Procedure: TRANSESOPHAGEAL ECHOCARDIOGRAM (TEE);  Surgeon: Purcell Nails, MD;  Location: [facility] OR;  Service: Open Heart Surgery;  Laterality: N/A;   TEE WITHOUT CARDIOVERSION N/A 04/24/2018   Procedure: TRANSESOPHAGEAL ECHOCARDIOGRAM (TEE);  Surgeon: Yates Decamp, MD;  Location: [facility] ENDOSCOPY;  Service: Cardiovascular;  Laterality: N/A;   TUBAL LIGATION      OB History   No obstetric history on file.      Home Medications    Prior to Admission medications   Medication Sig Start Date End Date Taking? Authorizing Provider  aspirin EC 325 MG EC tablet Take 1 tablet (325 mg total) by mouth daily. 05/19/18  Yes Barrett, Stephene Alegria R, PA-C  atorvastatin (LIPITOR) 40 MG tablet Take 40 mg by mouth at bedtime.    Yes [provider]  baclofen (LIORESAL) 10 MG tablet Take 1 tablet (10 mg total) by mouth 2 (two) times daily as needed for muscle spasms. 06/07/23  Yes Choua Ikner K, PA-C  ibuprofen (ADVIL) 600 MG tablet Take 1 tablet (600 mg total) by mouth every 8 (eight) hours as needed. 06/07/23  Yes Aava Deland K, PA-C  lidocaine (LIDODERM) 5 % Place 1 patch onto the skin daily. Remove &  Discard patch within 12 hours or as directed by MD 06/07/23  Yes Davinia Riccardi, Denny Peon K, PA-C  metoprolol tartrate (LOPRESSOR) 25 MG tablet Take 1 tablet (25 mg total) by mouth 2 (two) times daily. 05/19/18   Barrett, Jamorion Gomillion R, PA-C  Multiple Vitamin (MULTIVITAMIN WITH MINERALS) TABS tablet Take 1 tablet by mouth daily.    [provider]    Family History History reviewed. No pertinent family history.  Social History Social History   Tobacco Use   Smoking status: Never   Smokeless tobacco: Never  Vaping Use   Vaping status: Never Used   Substance Use Topics   Alcohol use: Never   Drug use: Never     Allergies   Patient has no known allergies.   Review of Systems Review of Systems  Constitutional:  Positive for activity change. Negative for appetite change, fatigue and fever.  Respiratory:  Negative for shortness of breath.   Cardiovascular:  Negative for chest pain.  Gastrointestinal:  Negative for abdominal pain, diarrhea, nausea and vomiting.  Genitourinary:  Negative for dysuria, frequency and urgency.  Musculoskeletal:  Positive for back pain. Negative for arthralgias and myalgias.  Neurological:  Negative for weakness and numbness.     Physical Exam Triage Vital Signs ED Triage Vitals  Encounter Vitals Group     BP 06/07/23 1124 (!) 146/88     Systolic BP Percentile --      Diastolic BP Percentile --      Pulse Rate 06/07/23 1124 78     Resp 06/07/23 1124 16     Temp 06/07/23 1124 97.8 F (36.6 C)     Temp Source 06/07/23 1124 Oral     SpO2 06/07/23 1124 98 %     Weight --      Height --      Head Circumference --      Peak Flow --      Pain Score 06/07/23 1123 10     Pain Loc --      Pain Education --      Exclude from Growth Chart --    No data found.  Updated Vital Signs BP (!) 146/88 (BP Location: Right Arm)   Pulse 78   Temp 97.8 F (36.6 C) (Oral)   Resp 16   SpO2 98%   Visual Acuity Right Eye Distance:   Left Eye Distance:   Bilateral Distance:    Right Eye Near:   Left Eye Near:    Bilateral Near:     Physical Exam Vitals reviewed.  Constitutional:      General: She is awake. She is not in acute distress.    Appearance: Normal appearance. She is well-developed. She is not ill-appearing.     Comments: Very pleasant female appears stated age in no acute distress sitting comfortably in exam room  HENT:     Head: Normocephalic and atraumatic.  Cardiovascular:     Rate and Rhythm: Normal rate and regular rhythm.     Heart sounds: Normal heart sounds, S1 normal and S2  normal. No murmur heard. Pulmonary:     Effort: Pulmonary effort is normal.     Breath sounds: Normal breath sounds. No wheezing, rhonchi or rales.     Comments: Clear to auscultation bilaterally Abdominal:     Palpations: Abdomen is soft.     Tenderness: There is no abdominal tenderness.  Musculoskeletal:     Cervical back: No tenderness or bony tenderness.     Thoracic back:  No tenderness or bony tenderness.     Lumbar back: Spasms and tenderness present. No bony tenderness. Negative right straight leg raise test and negative left straight leg raise test.     Comments: Back: Tenderness palpation of bilateral lumbar paraspinal muscles.  No pain percussion of vertebrae.  No deformity or step-off noted.  Spasm noted bilateral lumbar paraspinal muscles.  Negative straight leg raise bilaterally.  Strength 5/5 bilateral lower extremities.  Decreased range of motion with forward flexion, extension, rotation secondary to pain.  Psychiatric:        Behavior: Behavior is cooperative.      UC Treatments / Results  Labs (all labs ordered are listed, but only abnormal results are displayed) Labs Reviewed - No data to display  EKG   Radiology No results found.  Procedures Procedures (including critical care time)  Medications Ordered in UC Medications  ibuprofen (ADVIL) tablet 800 mg (800 mg Oral Given 06/07/23 1140)    Initial Impression / Assessment and Plan / UC Course  I have reviewed the triage vital signs and the nursing notes.  Pertinent labs & imaging results that were available during my care of the patient were reviewed by me and considered in my medical decision making (see chart for details).     Patient is well-appearing, afebrile, nontoxic, nontachycardic.  Patient denies any alarm symptoms that warrant emergent evaluation or imaging.  Plain films were deferred as she denies any recent trauma and has no focal bony tenderness.  Suspect muscular etiology.  She was started  on ibuprofen 600 mg up to 3 times a day with instruction not to take additional NSAIDs with this medication due to risk of GI bleeding.  Can use acetaminophen/Tylenol for breakthrough pain.  Was prescribed baclofen up to twice a day.  Discussed this can be sedating and she is not to drive or drink alcohol while taking it.  She was also given lidocaine patches for symptom relief and we discussed that these should be placed for 12 hours during the day and then remove for 12 hours at night; use only 1 patch per 24 hours.  Recommended conservative treatment measures including heat, rest, stretch.  She is to follow-up with sports medicine if her symptoms or not improving quickly and was given contact information for local provider with instruction to call to schedule an appointment.  Discussed that if she has any worsening symptoms including severe pain, bowel/bladder incontinence, lower extremity weakness, saddle anesthesia she needs to be seen immediately.  Strict return precautions given.  Work excuse note declined.   Final Clinical Impressions(s) / UC Diagnoses   Final diagnoses:  Strain of lumbar region, initial encounter  Spasm of lumbar paraspinous muscle     Discharge Instructions      I believe that you have injured the muscles in your back.  Please start ibuprofen up to 3 times a day.  Do not take other NSAIDs with this medication including aspirin, ibuprofen/Advil, naproxen/Aleve.  You can use acetaminophen/Tylenol for breakthrough pain.  Take baclofen up to 2 times a day.  This will make you sleepy so do not drive or drink alcohol taking it.  Apply lidocaine patch for 12 hours during the day; remove this for 12 hours at night.  Use only 1 patch per 24 hours.  I recommend you follow-up with sports medicine; call to schedule an appointment.  If anything worsens and you have severe pain, going to the bathroom on yourself without noticing it, numbness or tingling  in your legs, weakness in your legs  you need to be seen immediately.      ED Prescriptions     Medication Sig Dispense Auth. Provider   ibuprofen (ADVIL) 600 MG tablet Take 1 tablet (600 mg total) by mouth every 8 (eight) hours as needed. 30 tablet Amina Menchaca K, PA-C   baclofen (LIORESAL) 10 MG tablet Take 1 tablet (10 mg total) by mouth 2 (two) times daily as needed for muscle spasms. 20 each Althea Backs K, PA-C   lidocaine (LIDODERM) 5 % Place 1 patch onto the skin daily. Remove & Discard patch within 12 hours or as directed by MD 14 patch Sheleen Conchas K, PA-C      PDMP not reviewed this encounter.   Jeani Hawking, PA-C 06/07/23 1142

## 2023-06-07 NOTE — Discharge Instructions (Addendum)
I believe that you have injured the muscles in your back.  Please start ibuprofen up to 3 times a day.  Do not take other NSAIDs with this medication including aspirin, ibuprofen/Advil, naproxen/Aleve.  You can use acetaminophen/Tylenol for breakthrough pain.  Take baclofen up to 2 times a day.  This will make you sleepy so do not drive or drink alcohol taking it.  Apply lidocaine patch for 12 hours during the day; remove this for 12 hours at night.  Use only 1 patch per 24 hours.  I recommend you follow-up with sports medicine; call to schedule an appointment.  If anything worsens and you have severe pain, going to the bathroom on yourself without noticing it, numbness or tingling in your legs, weakness in your legs you need to be seen immediately.

## 2023-06-10 NOTE — Plan of Care (Signed)
[facility] Tonsillectomy/Adenoidectomy, Postoperative PEDS care plan entered in error.

## 2023-11-15 ENCOUNTER — Other Ambulatory Visit: Payer: Self-pay | Admitting: Internal Medicine

## 2023-11-15 DIAGNOSIS — Z1231 Encounter for screening mammogram for malignant neoplasm of breast: Secondary | ICD-10-CM

## 2024-01-14 ENCOUNTER — Encounter: Payer: Self-pay | Admitting: Internal Medicine

## 2024-02-12 ENCOUNTER — Other Ambulatory Visit: Payer: Self-pay | Admitting: Internal Medicine

## 2024-02-12 DIAGNOSIS — Z1231 Encounter for screening mammogram for malignant neoplasm of breast: Secondary | ICD-10-CM
